# Patient Record
Sex: Female | Born: 1935 | Hispanic: No | Marital: Single
Health system: Midwestern US, Academic
[De-identification: ages and names within clinical notes are randomized; demographics above are authoritative.]

---

## 2016-08-27 MED ORDER — RANEXA 1,000 MG PO TB12
ORAL_TABLET | Freq: Two times a day (BID) | 6 refills | Status: DC
Start: 2016-08-27 — End: 2017-01-20

## 2016-10-20 ENCOUNTER — Encounter: Admit: 2016-10-20 | Discharge: 2016-10-20 | Payer: MEDICARE

## 2016-10-21 LAB — COMPREHENSIVE METABOLIC PANEL
Lab: 18 — ABNORMAL HIGH (ref 0–14)
Lab: 20
Lab: 23

## 2016-11-04 ENCOUNTER — Encounter: Admit: 2016-11-04 | Discharge: 2016-11-04 | Payer: MEDICARE

## 2016-11-04 ENCOUNTER — Ambulatory Visit: Admit: 2016-11-04 | Discharge: 2016-11-05 | Payer: MEDICARE

## 2016-11-04 DIAGNOSIS — I25118 Atherosclerotic heart disease of native coronary artery with other forms of angina pectoris: Principal | ICD-10-CM

## 2016-11-04 DIAGNOSIS — I10 Essential (primary) hypertension: ICD-10-CM

## 2016-11-04 DIAGNOSIS — E119 Type 2 diabetes mellitus without complications: ICD-10-CM

## 2016-11-04 DIAGNOSIS — Z9049 Acquired absence of other specified parts of digestive tract: Principal | ICD-10-CM

## 2016-11-04 DIAGNOSIS — H269 Unspecified cataract: ICD-10-CM

## 2016-11-04 DIAGNOSIS — E785 Hyperlipidemia, unspecified: ICD-10-CM

## 2016-11-04 MED ORDER — FUROSEMIDE 20 MG PO TAB
20 mg | ORAL_TABLET | Freq: Every morning | ORAL | 0 refills | 90.00000 days | Status: AC
Start: 2016-11-04 — End: 2017-03-07

## 2016-11-04 MED ORDER — ASPIRIN 325 MG PO TAB
325 mg | ORAL_TABLET | Freq: Every day | ORAL | 3 refills | 30.00000 days | Status: AC
Start: 2016-11-04 — End: 2017-05-26

## 2016-11-04 NOTE — Assessment & Plan Note
Lab Results   Component Value Date    CHOL 141 (L) 11/04/2015    TRIG 129 11/04/2015    HDL 39 11/04/2015    LDL 82 11/04/2015    VLDL 26 11/04/2015    CHOLHDLC 4 11/04/2015      LDL good on current medical program.

## 2016-11-04 NOTE — Progress Notes
Date of Service: 11/04/2016    Diamond Weber is a 83 y.o. female.       HPI     Sister Shajuana was in the [place] office today for follow-up regarding coronary disease and aortic stenosis.  When I last saw her in December she was actually doing relatively well but lately she has been using more sublingual nitroglycerin and having more trouble with breathlessness.  She tells me that she used 5 sublingual nitroglycerin tablets yesterday.  She says that some days she does not use any nitroglycerin at all.    Last week she took a 20 mg furosemide tablet that she had and did notice that she urinated more and felt better that day.    She did have lab work done last week and her serum creatinine is stable at 2.14.  The hemoglobin was also stable at 10.7.  I am not sure why but her erythrocyte sedimentation rate was quite elevated at 75.    She has not gained weight and has not had problems with peripheral edema.  She denies palpitations, syncope, or near syncope.  She has had no TIA or stroke symptoms.         Vitals:    11/04/16 0841 11/04/16 0849   BP: 152/74 154/68   Pulse: 66    Weight: 66 kg (145 lb 9.6 oz)    Height: 1.626 m (5' 4)      Body mass index is 24.99 kg/m???.     Past Medical History  Patient Active Problem List    Diagnosis Date Noted   ??? Hematochezia 07/24/2015   ??? Centrilobular emphysema (HCC) 06/05/2015     05/2015 - PFT's @ [facility]:  FEV1 1.13 L, 70%.  Diffusion 54% of predicted.  05/29/15 CXR [facility]):  moderate emphysematous changes bilaterally with basilar atelectasis versus scar.  10/2015 - PFT's (Dr. Christena Deem office):  FEV1 53% predicted, TLC 71%.  Unable to cooperate with study, diffusion couldn't be done.  10/2015 - Non-contrast CT negative for interstitial lung disease.     ??? Posterior capsular opacification, left 01/13/2015   ??? Nonrheumatic aortic valve stenosis 12/03/2014     2012 - Echo showed mean gradient 14 mmHg     ??? Pseudophakia, both eyes 11/04/2014 YAG OD 10/16/14 --ant and posterior     ??? 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 Renal insufficiency 12/04/2013     10/2013 - ACE inhibitor discontinued with modest improvement in serum creatinine level.     ??? 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 Type 2 diabetes mellitus without retinopathy (HCC) 11/06/2013   ??? Epiretinal membrane, right eye 11/06/2013     Noted on OCT 11/06/13     ??? CAD (coronary artery disease) 11/11/2008     5/04 - Exercise echo showed anterior and lateral hypokinesis that worsened with stress       6/04 - Cath at [facility]: 3-vessel disease involving LAD, 3rd OM, and ostium and mid RCA       6/04 - CABG x 4 ([facility]): LIMA to LAD, SVG in sequence to 1st DX and 3rd OM,  SVG to distal RCA        7/05 - Stress thallium: EF 76%, non-ischemic.  02/02/05: Exercise Thallium. 6:17 min- 149 bpm (101 MPHR). low risk for significant jeopardized ischemic myocardium. Equivocal area of slight        reversibility in the lateral wall which may be artifactual. The defect may be in the intermedius-type territorial distribution but it is mild. EF 71%  12/03/08: Lexicon Sestamibi: low likelihood for ischemic/jeopardized myocardium. Some subtle fixed reduction in apical tracer activity is noted, but  in light of reasonably preserved segmental wall motion and thickening, this probably represents soft tissue attenuation or apical thinning.  EF 67%. No change since 01/2005  ??? November 05, 2011 Carotid US [facility]):  Potential for stenosis of <40% within bilateral prox internal carotid arteries.  Antegrade flow in the vertebral arteries.  Atherosclerotic plaquing present.     ??? HTN (hypertension) 11/11/2008     11/15/08: Echo: EF 55%. No signficant valvular stenosis or regurgitation. Mild LVH  11/15/08: Renal Duplex at [facility]. No renal artery stenosis.      ??? Diabetes mellitus (HCC) 11/11/2008      2002 - Diagnosis established. Type 2     ??? Dyslipidemia 11/11/2008      Intolerance to Lescol, Pravachol, Zocor due to myalgia (leg pain).     ??? Hypothyroidism 11/11/2008         Review of Systems   Constitution: Positive for malaise/fatigue.   HENT: Positive for hearing loss and hoarse voice.    Eyes: Negative.    Cardiovascular: Positive for chest pain and dyspnea on exertion.   Respiratory: Positive for shortness of breath and wheezing.    Endocrine: Negative.    Hematologic/Lymphatic: Bruises/bleeds easily.   Skin: Positive for dry skin.   Musculoskeletal: Negative.    Gastrointestinal: Negative.    Genitourinary: Negative.    Neurological: Negative.    Psychiatric/Behavioral: Negative.    Allergic/Immunologic: Negative.        Physical Exam    Physical Exam   General Appearance: no distress   Skin: warm, no ulcers or xanthomas   Digits and Nails: no cyanosis or clubbing   Eyes: conjunctivae and lids normal, pupils are equal and round   Teeth/Gums/Palate: dentition unremarkable, no lesions   Lips & Oral Mucosa: no pallor or cyanosis   Neck Veins: normal JVP , neck veins are not distended   Thyroid: no nodules, masses, tenderness or enlargement   Chest Inspection: chest is normal in appearance   Respiratory Effort: breathing comfortably, no respiratory distress   Auscultation/Percussion: lungs clear to auscultation, no rales or rhonchi, no wheezing   PMI: PMI not enlarged or displaced   Cardiac Rhythm: regular rhythm and normal rate   Cardiac Auscultation: S1, S2 normal, no rub, no gallop   Murmurs: grade 3 systolic murmur   Peripheral Circulation: normal peripheral circulation   Carotid Arteries: normal carotid upstroke bilaterally, no bruits   Radial Arteries: normal symmetric radial pulses   Abdominal Aorta: no abdominal aortic bruit   Pedal Pulses: normal symmetric pedal pulses Lower Extremity Edema: no lower extremity edema   Abdominal Exam: soft, non-tender, no masses, bowel sounds normal   Liver & Spleen: no organomegaly   Gait & Station: walks without assistance   Muscle Strength: normal muscle tone   Orientation: oriented to time, place and person   Affect & Mood: appropriate and sustained affect   Language and Memory: patient responsive and seems to comprehend information  Neurologic Exam: neurological assessment grossly intact   Other: moves all extremities        Problems Addressed Today  Encounter Diagnoses   Name Primary?   ??? Coronary artery disease of native heart with stable angina pectoris, unspecified vessel or lesion type (HCC)    ??? Essential hypertension    ??? Dyslipidemia        Assessment and Plan       CAD (coronary artery disease)  Unfortunately she is having more dyspnea and angina since I saw her 6 months ago.  She says, for example, that she used 5 sublingual nitroglycerin tablets yesterday.    I had initially considered the addition of amlodipine to her antianginal medical program but she then told me that she took a 20 mg furosemide one day last week and thought that it did help with her breathlessness and chest discomfort.    She had lab work last week showing stable serum creatinine and I visited with Dr. Suzanna Obey about the possibility of adding furosemide 20 mg per day to see if this helps with her symptoms.  The next step might be adding amlodipine 2.5 mg per day, if necessary.  We'll call her in a couple of weeks to see if the diuretic seems to be helping.    HTN (hypertension)  Her blood pressure definitely is not too low and even if we need to add a little bit of amlodipine I do not think it will hurt.  I see that amlodipine is listed as an allergy but the reaction was some sort of facial flushing and I think we might be able to try to get away with a very low dose if necessary.    Dyslipidemia  Lab Results   Component Value Date    CHOL 141 (L) 11/04/2015 TRIG 129 11/04/2015    HDL 39 11/04/2015    LDL 82 11/04/2015    VLDL 26 11/04/2015    CHOLHDLC 4 11/04/2015      LDL good on current medical program.      Current Medications (including today's revisions)  ??? aspirin 325 mg tablet Take 1 tablet by mouth daily. Take with food.   ??? ferrous sulfate 325 mg (65 mg iron) tablet Take 325 mg by mouth twice daily.   ??? furosemide (LASIX) 20 mg tablet Take 1 tablet by mouth every morning.   ??? hydrALAZINE (APRESOLINE) 100 mg tablet Take 100 mg by mouth twice daily.   ??? insulin detemir(+) (LEVEMIR) 100 unit/mL soln Inject 10 Units under the skin daily.   ??? isosorbide mononitrate CR (IMDUR) 120 mg tablet TAKE 1 TABLET BY MOUTH IN THE MORNING   ??? levothyroxine (SYNTHROID) 50 mcg tablet Take 50 mcg by mouth Daily.   ??? metoprolol XL (TOPROL XL) 100 mg extended release tablet Take 100 mg by mouth daily.   ??? nitroglycerin (NITROSTAT) 0.4 mg tablet Place 1 Tab under tongue every 5 minutes as needed for Chest Pain.   ??? pramipexole (MIRAPEX) 0.5 mg tablet Take 0.5 mg by mouth Daily.   ??? prednisolone acetate (PRED FORTE) 1 % ophthalmic suspension Apply 1 drop to both eyes every 48 hours.   ??? RANEXA 1,000 mg tablet TAKE 1 TABLET BY MOUTH TWICE A DAY   ??? simvastatin (ZOCOR) 20 mg tablet Take 20 mg by mouth At Bedtime Daily.

## 2016-11-04 NOTE — Assessment & Plan Note
Her blood pressure definitely is not too low and even if we need to add a little bit of amlodipine I do not think it will hurt.  I see that amlodipine is listed as an allergy but the reaction was some sort of facial flushing and I think we might be able to try to get away with a very low dose if necessary.

## 2016-11-18 ENCOUNTER — Encounter: Admit: 2016-11-18 | Discharge: 2016-11-18 | Payer: MEDICARE

## 2016-11-23 ENCOUNTER — Encounter: Admit: 2016-11-23 | Discharge: 2016-11-23 | Payer: MEDICARE

## 2016-11-23 MED ORDER — AMLODIPINE 5 MG PO TAB
2.5 mg | ORAL_TABLET | Freq: Every day | ORAL | 3 refills | Status: AC
Start: 2016-11-23 — End: 2017-05-26

## 2016-11-23 NOTE — Progress Notes
Patient reports issues with taking lasix daily incontinence of urine.  She does take it prn for relief of shortness of breath.  Today she reports fullness and some doe.  She did take lasix today for symptoms.  Initial Bp was 180/62  P68.  After sitting for 65min down to 158/68.  Review last office note SDO proposed starting Norvasc 2.5 mg daily.  Discussed with him in clinic.  Will attempt norvasc scheduled bp check in 2 wks patient is to call if issues.

## 2016-12-06 ENCOUNTER — Encounter: Admit: 2016-12-06 | Discharge: 2016-12-06 | Payer: MEDICARE

## 2016-12-06 MED ORDER — ISOSORBIDE MONONITRATE 120 MG PO TB24
ORAL_TABLET | ORAL | 3 refills | 90.00000 days | Status: AC
Start: 2016-12-06 — End: ?

## 2016-12-07 ENCOUNTER — Encounter: Admit: 2016-12-07 | Discharge: 2016-12-07 | Payer: MEDICARE

## 2016-12-07 NOTE — Progress Notes
Patient states he is doing better as far as chest pressure/pain is concerned.  She states she has not required any nitro at all pressures still 140's/60-70's.  She has noticed some congestion and flushing since starting her amlodipine.  She still takes lasix only occasionally because of incontinence.

## 2016-12-08 ENCOUNTER — Encounter: Admit: 2016-12-08 | Discharge: 2016-12-08 | Payer: MEDICARE

## 2016-12-08 NOTE — Telephone Encounter
-----   Message from Michiel Cowboy, MD sent at 12/08/2016  8:44 AM CDT -----  Regarding: RE: Sr Jeanne  Wouldn't hurt to try low-dose furosemide if she's willing.  ----- Message -----  From: Asencion Noble  Sent: 12/07/2016   4:28 PM  To: Michiel Cowboy, MD, Asencion Noble  Subject: Sr Lanae Crumbly was in for bp check since starting norvasc.  She had intolerance of this before and therefore it was started at 2.5mg .  Today she states chest pressure/pain is gone.  She has not had to take nitro since starting.  She does c/o congestion since starting and her face appeared flushed.  Recommendations?  She is still not taking lasix on a regular basis because of incontinence.  Do you think splitting dose 10 mg in am and 10mg  at noon might help?    Thanks  Richardson Landry

## 2016-12-08 NOTE — Telephone Encounter
Wouldn't hurt to try low-dose furosemide if she's willing.

## 2016-12-09 NOTE — Telephone Encounter
Discussed with SDO.  The sx she is experiencing are usually not side effects of the medication.  Advised patient to try taking amlodipine at night to see if sx improve, continue to monitor blood pressure and call with any questions or concerns.  Flag forwarded to f/u with patient in 10-14 days to see if sx improve.

## 2016-12-17 ENCOUNTER — Encounter: Admit: 2016-12-17 | Discharge: 2016-12-17 | Payer: MEDICARE

## 2016-12-17 NOTE — Telephone Encounter
Spoke with pt and pt thinks that her flushing is less but still has the congestion. States that the congestion is tolerable.

## 2016-12-17 NOTE — Telephone Encounter
-----   Message from Betsy Pries, RN sent at 12/09/2016  1:08 PM CDT -----  Regarding: call pt   See note.  Call pt to see if flushing and congestion have improved after switching amlodipine to the evening.

## 2017-01-20 ENCOUNTER — Encounter: Admit: 2017-01-20 | Discharge: 2017-01-20 | Payer: MEDICARE

## 2017-01-20 MED ORDER — RANOLAZINE 1,000 MG PO TB12
1000 mg | ORAL_TABLET | Freq: Two times a day (BID) | ORAL | 6 refills | Status: AC
Start: 2017-01-20 — End: 2017-01-20

## 2017-01-20 MED ORDER — RANOLAZINE 1,000 MG PO TB12
1000 mg | ORAL_TABLET | Freq: Two times a day (BID) | ORAL | 3 refills | Status: AC
Start: 2017-01-20 — End: ?

## 2017-02-04 ENCOUNTER — Encounter: Admit: 2017-02-04 | Discharge: 2017-02-04 | Payer: MEDICARE

## 2017-02-04 ENCOUNTER — Ambulatory Visit: Admit: 2017-02-04 | Discharge: 2017-02-05 | Payer: MEDICARE

## 2017-02-04 DIAGNOSIS — H269 Unspecified cataract: ICD-10-CM

## 2017-02-04 DIAGNOSIS — E119 Type 2 diabetes mellitus without complications: ICD-10-CM

## 2017-02-04 DIAGNOSIS — Z9049 Acquired absence of other specified parts of digestive tract: Principal | ICD-10-CM

## 2017-02-04 DIAGNOSIS — Z947 Corneal transplant status: Principal | ICD-10-CM

## 2017-02-04 DIAGNOSIS — I10 Essential (primary) hypertension: ICD-10-CM

## 2017-02-04 DIAGNOSIS — H26492 Other secondary cataract, left eye: ICD-10-CM

## 2017-02-04 DIAGNOSIS — Z961 Presence of intraocular lens: ICD-10-CM

## 2017-02-04 MED ORDER — PREDNISOLONE ACETATE 1 % OP DRPS
1 [drp] | OPHTHALMIC | 6 refills | 14.00000 days | Status: AC
Start: 2017-02-04 — End: 2017-10-14

## 2017-02-04 NOTE — Assessment & Plan Note
Does the patient have Diabetes? Yes,  and the patient has NO evidence of retinopathy and/or macular edema today.

## 2017-02-04 NOTE — Assessment & Plan Note
DSAEK clear OU without KP  P_PF TIW OU

## 2017-02-04 NOTE — Assessment & Plan Note
Bread crumb changes temp outside vis axis  P--observ

## 2017-02-04 NOTE — Progress Notes
There is no height or weight on file to calculate BMI.              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 Type 2     Bullous Keratopathy of Both Eyes (Resolved)    Pseudophakic Bullous Keratopathy OD>OS  S/p CE OU approx 2011   Pachy: 693 OD, 712 OS 11/06/13         Status post corneal transplant  DSAEK clear OU without KP  P_PF TIW OU    Pseudophakia, both eyes  Capsule Open OD--phimosis stable  Doing well  No new MRx    Posterior capsular opacification, left  Bread crumb changes temp outside vis axis  P--observ    Type 2 diabetes mellitus without complication, without long-term current use of insulin ([place])  Does the patient have Diabetes? Yes,  and the patient has NO evidence of retinopathy and/or macular edema today.    Next Visit:         MRx x   ECC    Gallelei    Atlas    OCT (M)(N)    Orbscan    LG    TF BUT    Schirmer    IOP x   Pach x   Dilate x   BAM x   B-Scan    HVF    TOSM          Kelton Pillar, MD

## 2017-02-04 NOTE — Assessment & Plan Note
Capsule Open OD--phimosis stable  Doing well  No new MRx

## 2017-03-07 ENCOUNTER — Encounter: Admit: 2017-03-07 | Discharge: 2017-03-07 | Payer: MEDICARE

## 2017-03-07 MED ORDER — FUROSEMIDE 20 MG PO TAB
ORAL_TABLET | Freq: Every day | 6 refills | Status: SS
Start: 2017-03-07 — End: 2017-06-24

## 2017-03-31 LAB — COMPREHENSIVE METABOLIC PANEL
Lab: 0.3
Lab: 10 — ABNORMAL HIGH (ref 8.4–10.2)
Lab: 111 — ABNORMAL HIGH (ref 83–110)
Lab: 137 — ABNORMAL LOW (ref 4.20–5.40)
Lab: 16
Lab: 16 — ABNORMAL HIGH (ref 0–14)
Lab: 18
Lab: 2 — ABNORMAL HIGH (ref 0.57–1.11)
Lab: 46 — ABNORMAL HIGH (ref 9.8–20.1)
Lab: 7.5
Lab: 76

## 2017-03-31 LAB — BNP (B-TYPE NATRIURETIC PEPTI)

## 2017-03-31 LAB — CBC
Lab: 10 — ABNORMAL LOW (ref 12.0–16.0)
Lab: 93

## 2017-05-16 ENCOUNTER — Encounter: Admit: 2017-05-16 | Discharge: 2017-05-16 | Payer: MEDICARE

## 2017-05-26 ENCOUNTER — Encounter: Admit: 2017-05-26 | Discharge: 2017-05-26 | Payer: MEDICARE

## 2017-05-26 ENCOUNTER — Ambulatory Visit: Admit: 2017-05-26 | Discharge: 2017-05-27 | Payer: MEDICARE

## 2017-05-26 DIAGNOSIS — I10 Essential (primary) hypertension: Principal | ICD-10-CM

## 2017-05-26 DIAGNOSIS — I350 Nonrheumatic aortic (valve) stenosis: ICD-10-CM

## 2017-05-26 DIAGNOSIS — H269 Unspecified cataract: ICD-10-CM

## 2017-05-26 DIAGNOSIS — I25118 Atherosclerotic heart disease of native coronary artery with other forms of angina pectoris: ICD-10-CM

## 2017-05-26 DIAGNOSIS — E119 Type 2 diabetes mellitus without complications: ICD-10-CM

## 2017-05-26 DIAGNOSIS — Z9049 Acquired absence of other specified parts of digestive tract: Secondary | ICD-10-CM

## 2017-05-26 MED ORDER — NICARDIPINE 20 MG PO CAP
20 mg | ORAL_CAPSULE | Freq: Three times a day (TID) | ORAL | 3 refills | Status: SS
Start: 2017-05-26 — End: 2017-07-07

## 2017-06-10 ENCOUNTER — Encounter: Admit: 2017-06-10 | Discharge: 2017-06-10 | Payer: MEDICARE

## 2017-06-23 ENCOUNTER — Ambulatory Visit: Admit: 2017-06-23 | Discharge: 2017-06-24 | Payer: MEDICARE

## 2017-06-23 ENCOUNTER — Encounter: Admit: 2017-06-23 | Discharge: 2017-06-23 | Payer: MEDICARE

## 2017-06-23 DIAGNOSIS — E119 Type 2 diabetes mellitus without complications: ICD-10-CM

## 2017-06-23 DIAGNOSIS — E785 Hyperlipidemia, unspecified: ICD-10-CM

## 2017-06-23 DIAGNOSIS — Z9049 Acquired absence of other specified parts of digestive tract: Principal | ICD-10-CM

## 2017-06-23 DIAGNOSIS — H269 Unspecified cataract: ICD-10-CM

## 2017-06-23 DIAGNOSIS — I25118 Atherosclerotic heart disease of native coronary artery with other forms of angina pectoris: ICD-10-CM

## 2017-06-23 DIAGNOSIS — I350 Nonrheumatic aortic (valve) stenosis: ICD-10-CM

## 2017-06-23 DIAGNOSIS — N289 Disorder of kidney and ureter, unspecified: ICD-10-CM

## 2017-06-23 DIAGNOSIS — R079 Chest pain, unspecified: Principal | ICD-10-CM

## 2017-06-23 DIAGNOSIS — I10 Essential (primary) hypertension: ICD-10-CM

## 2017-06-23 DIAGNOSIS — I209 Angina pectoris, unspecified: Principal | ICD-10-CM

## 2017-06-23 MED ORDER — SODIUM CHLORIDE 0.9 % IV SOLP
250 mL | INTRAVENOUS | 0 refills | Status: CN | PRN
Start: 2017-06-23 — End: ?

## 2017-06-23 MED ORDER — LIDOCAINE (PF) 10 MG/ML (1 %) IJ SOLN
.1-2 mL | INTRAMUSCULAR | 0 refills | Status: CN | PRN
Start: 2017-06-23 — End: ?

## 2017-06-24 ENCOUNTER — Ambulatory Visit: Admit: 2017-06-24 | Discharge: 2017-06-24 | Payer: MEDICARE

## 2017-06-24 ENCOUNTER — Encounter: Admit: 2017-06-24 | Discharge: 2017-06-24 | Payer: MEDICARE

## 2017-06-24 DIAGNOSIS — Z951 Presence of aortocoronary bypass graft: ICD-10-CM

## 2017-06-24 DIAGNOSIS — I10 Essential (primary) hypertension: ICD-10-CM

## 2017-06-24 DIAGNOSIS — R079 Chest pain, unspecified: Principal | ICD-10-CM

## 2017-06-24 DIAGNOSIS — E119 Type 2 diabetes mellitus without complications: ICD-10-CM

## 2017-06-24 DIAGNOSIS — E785 Hyperlipidemia, unspecified: ICD-10-CM

## 2017-06-24 DIAGNOSIS — I25118 Atherosclerotic heart disease of native coronary artery with other forms of angina pectoris: Secondary | ICD-10-CM

## 2017-06-24 DIAGNOSIS — N289 Disorder of kidney and ureter, unspecified: ICD-10-CM

## 2017-06-24 LAB — CBC
Lab: 10 g/dL — ABNORMAL LOW (ref 12.0–15.0)
Lab: 14 % (ref 11–15)
Lab: 281 K/UL — ABNORMAL LOW (ref >60)
Lab: 29 % — ABNORMAL LOW (ref 36–45)
Lab: 30 pg — ABNORMAL HIGH (ref 26–34)
Lab: 34 g/dL — ABNORMAL HIGH (ref 32.0–36.0)
Lab: 7.2 K/UL (ref 4.5–11.0)
Lab: 8.2 FL — ABNORMAL LOW (ref >60)
Lab: 89 FL — ABNORMAL HIGH (ref 80–100)

## 2017-06-24 LAB — BASIC METABOLIC PANEL
Lab: 101 MMOL/L — ABNORMAL LOW (ref 98–110)
Lab: 135 MMOL/L — ABNORMAL LOW (ref 137–147)

## 2017-06-24 LAB — LIPID PROFILE
Lab: 150 mg/dL — ABNORMAL LOW (ref <200)
Lab: 40 mg/dL — ABNORMAL LOW (ref >40)
Lab: 85 mg/dL — ABNORMAL HIGH (ref <100)

## 2017-06-24 LAB — POC GLUCOSE: Lab: 97 mg/dL (ref 70–100)

## 2017-06-24 MED ORDER — TEMAZEPAM 15 MG PO CAP
15 mg | Freq: Every evening | ORAL | 0 refills | Status: DC | PRN
Start: 2017-06-24 — End: 2017-06-25

## 2017-06-24 MED ORDER — ONDANSETRON HCL (PF) 4 MG/2 ML IJ SOLN
4 mg | INTRAVENOUS | 0 refills | Status: DC | PRN
Start: 2017-06-24 — End: 2017-06-25

## 2017-06-24 MED ORDER — ACETAMINOPHEN 325 MG PO TAB
650 mg | ORAL | 0 refills | Status: DC | PRN
Start: 2017-06-24 — End: 2017-06-25

## 2017-06-24 MED ORDER — SODIUM CHLORIDE 0.9 % IV SOLP
250 mL | INTRAVENOUS | 0 refills | Status: DC | PRN
Start: 2017-06-24 — End: 2017-06-25
  Administered 2017-06-24: 15:00:00 250 mL via INTRAVENOUS

## 2017-06-24 MED ORDER — LIDOCAINE (PF) 10 MG/ML (1 %) IJ SOLN
.1-2 mL | INTRAMUSCULAR | 0 refills | Status: DC | PRN
Start: 2017-06-24 — End: 2017-06-25

## 2017-06-24 MED ORDER — NITROGLYCERIN 0.4 MG SL SUBL
.4 mg | SUBLINGUAL | 0 refills | Status: DC | PRN
Start: 2017-06-24 — End: 2017-06-25

## 2017-06-24 MED ORDER — SODIUM CHLORIDE 0.9 % IV SOLP
1000 mL | INTRAVENOUS | 0 refills | Status: DC
Start: 2017-06-24 — End: 2017-06-25
  Administered 2017-06-24: 15:00:00 1000 mL via INTRAVENOUS

## 2017-06-24 MED ORDER — FUROSEMIDE 40 MG PO TAB
40 mg | ORAL_TABLET | Freq: Every morning | ORAL | 3 refills | Status: SS
Start: 2017-06-24 — End: 2017-07-07

## 2017-06-24 MED ORDER — DIPHENHYDRAMINE HCL 50 MG/ML IJ SOLN
25 mg | INTRAVENOUS | 0 refills | Status: DC | PRN
Start: 2017-06-24 — End: 2017-06-25

## 2017-06-24 MED ORDER — ALUMINUM-MAGNESIUM HYDROXIDE 200-200 MG/5 ML PO SUSP
30 mL | ORAL | 0 refills | Status: DC | PRN
Start: 2017-06-24 — End: 2017-06-25

## 2017-06-24 MED ORDER — ASPIRIN 325 MG PO TAB
325 mg | Freq: Once | ORAL | 0 refills | Status: DC
Start: 2017-06-24 — End: 2017-06-25

## 2017-06-24 MED ORDER — POTASSIUM CHLORIDE 10 MEQ PO TBER
10 meq | ORAL_CAPSULE | Freq: Every day | ORAL | 3 refills | Status: SS
Start: 2017-06-24 — End: 2017-07-07

## 2017-06-24 MED ORDER — DIPHENHYDRAMINE HCL 25 MG PO CAP
25 mg | ORAL | 0 refills | Status: DC | PRN
Start: 2017-06-24 — End: 2017-06-25

## 2017-07-01 ENCOUNTER — Ambulatory Visit: Admit: 2017-07-01 | Discharge: 2017-07-01 | Payer: MEDICARE

## 2017-07-01 ENCOUNTER — Encounter: Admit: 2017-07-01 | Discharge: 2017-07-01 | Payer: MEDICARE

## 2017-07-01 DIAGNOSIS — K922 Gastrointestinal hemorrhage, unspecified: ICD-10-CM

## 2017-07-01 LAB — PROTIME INR (PT): Lab: 1 M/UL — ABNORMAL LOW (ref 0.8–1.2)

## 2017-07-01 LAB — COMPREHENSIVE METABOLIC PANEL
Lab: 0.4 mg/dL (ref 0.3–1.2)
Lab: 11 U/L (ref 7–40)
Lab: 131 MMOL/L — ABNORMAL LOW (ref 137–147)
Lab: 21 MMOL/L (ref 21–30)
Lab: 23 mL/min — ABNORMAL LOW (ref >60)
Lab: 28 mL/min — ABNORMAL LOW (ref >60)
Lab: 3.3 g/dL — ABNORMAL LOW (ref 3.5–5.0)
Lab: 5.5 g/dL — ABNORMAL LOW (ref 6.0–8.0)
Lab: 51 U/L (ref 25–110)
Lab: 9 U/L (ref 7–56)
Lab: 7 (ref 3–12)

## 2017-07-01 LAB — PHOSPHORUS: Lab: 3.5 mg/dL — ABNORMAL HIGH (ref 2.0–4.5)

## 2017-07-01 LAB — CBC
Lab: 7 10*3/uL (ref 4.5–11.0)
Lab: 15 % — ABNORMAL HIGH (ref 11–15)
Lab: 2.6 M/UL — ABNORMAL LOW (ref 4.0–5.0)
Lab: 225 10*3/uL (ref 150–400)
Lab: 23 % — ABNORMAL LOW (ref 36–45)
Lab: 30 pg (ref 26–34)
Lab: 34 g/dL (ref 32.0–36.0)
Lab: 5.4 10*3/uL (ref 4.5–11.0)
Lab: 8 g/dL — ABNORMAL LOW (ref 12.0–15.0)
Lab: 8.3 FL (ref 7–11)
Lab: 88 FL (ref 80–100)

## 2017-07-01 LAB — BLOOD GASES, ARTERIAL
Lab: 111 mmHg — ABNORMAL HIGH (ref 80–100)
Lab: 23 MMOL/L — ABNORMAL LOW (ref 21–28)
Lab: 7.3 MMOL/L (ref 7.35–7.45)

## 2017-07-01 LAB — THYROID STIMULATING HORMONE-TSH: Lab: 1.9 uU/mL (ref 0.35–5.00)

## 2017-07-01 LAB — TROPONIN-I
Lab: 0 ng/mL (ref 0.0–0.05)
Lab: 0 ng/mL — ABNORMAL LOW (ref 0.0–0.05)

## 2017-07-01 LAB — LACTIC ACID (BG - RAPID LACTATE)
Lab: 1.2 MMOL/L (ref 0.5–2.0)
Lab: 1.3 MMOL/L (ref 0.5–2.0)

## 2017-07-01 LAB — PTT (APTT): Lab: 19 s — ABNORMAL LOW (ref 24.0–36.5)

## 2017-07-01 LAB — BNP (B-TYPE NATRIURETIC PEPTI): Lab: 155 pg/mL — ABNORMAL HIGH (ref 0–100)

## 2017-07-01 LAB — MAGNESIUM: Lab: 1.8 mg/dL — ABNORMAL HIGH (ref 1.6–2.6)

## 2017-07-01 LAB — POC GLUCOSE
Lab: 171 mg/dL — ABNORMAL HIGH (ref 70–100)
Lab: 140 mg/dL — ABNORMAL HIGH (ref 70–100)
Lab: 132 mg/dL — ABNORMAL HIGH (ref 70–100)

## 2017-07-01 MED ORDER — LEVOTHYROXINE 50 MCG PO TAB
50 ug | Freq: Every day | ORAL | 0 refills | Status: DC
Start: 2017-07-01 — End: 2017-07-07
  Administered 2017-07-01 – 2017-07-07 (×7): 50 ug via ORAL

## 2017-07-01 MED ORDER — INSULIN ASPART 100 UNIT/ML SC FLEXPEN
0-6 [IU] | Freq: Before meals | SUBCUTANEOUS | 0 refills | Status: DC
Start: 2017-07-01 — End: 2017-07-07
  Administered 2017-07-02 – 2017-07-05 (×2): 2 [IU] via SUBCUTANEOUS

## 2017-07-01 MED ORDER — PANTOPRAZOLE 40 MG IV SOLR
40 mg | Freq: Two times a day (BID) | INTRAVENOUS | 0 refills | Status: DC
Start: 2017-07-01 — End: 2017-07-07
  Administered 2017-07-01 – 2017-07-07 (×12): 40 mg via INTRAVENOUS

## 2017-07-01 MED ORDER — LACTATED RINGERS IV SOLP
500 mL | Freq: Once | INTRAVENOUS | 0 refills | Status: CP
Start: 2017-07-01 — End: ?
  Administered 2017-07-01: 17:00:00 500 mL via INTRAVENOUS

## 2017-07-01 MED ORDER — ONDANSETRON HCL (PF) 4 MG/2 ML IJ SOLN
4 mg | INTRAVENOUS | 0 refills | Status: DC | PRN
Start: 2017-07-01 — End: 2017-07-07
  Administered 2017-07-01 – 2017-07-02 (×2): 4 mg via INTRAVENOUS

## 2017-07-01 MED ORDER — PRAMIPEXOLE 1 MG PO TAB
.5 mg | Freq: Every day | ORAL | 0 refills | Status: DC
Start: 2017-07-01 — End: 2017-07-07
  Administered 2017-07-01 – 2017-07-07 (×5): 0.5 mg via ORAL

## 2017-07-02 LAB — POC GLUCOSE
Lab: 152 mg/dL — ABNORMAL HIGH (ref 70–100)
Lab: 237 mg/dL — ABNORMAL HIGH (ref 70–100)
Lab: 253 mg/dL — ABNORMAL HIGH (ref 70–100)
Lab: 175 mg/dL — ABNORMAL HIGH (ref 70–100)
Lab: 111 mg/dL — ABNORMAL HIGH (ref 70–100)

## 2017-07-02 LAB — CBC
Lab: 15 % — ABNORMAL HIGH (ref 11–15)
Lab: 2.7 M/UL — ABNORMAL LOW (ref 4.0–5.0)
Lab: 24 % — ABNORMAL LOW (ref 36–45)
Lab: 267 10*3/uL — ABNORMAL LOW (ref >60)
Lab: 29 pg — ABNORMAL HIGH (ref 26–34)
Lab: 33 g/dL (ref 32.0–36.0)
Lab: 8.1 g/dL — ABNORMAL LOW (ref 12.0–15.0)
Lab: 8.3 FL (ref >60)
Lab: 8.8 10*3/uL (ref 4.5–11.0)
Lab: 88 FL — ABNORMAL HIGH (ref 80–100)
Lab: 148 10*3/uL — ABNORMAL LOW (ref 150–400)
Lab: 16 % — ABNORMAL HIGH (ref 11–15)
Lab: 2.3 M/UL — ABNORMAL LOW (ref 4.0–5.0)
Lab: 20 % — ABNORMAL LOW (ref 36–45)
Lab: 30 pg (ref 26–34)
Lab: 34 g/dL (ref 32.0–36.0)
Lab: 6.4 10*3/uL (ref 4.5–11.0)
Lab: 7 g/dL — ABNORMAL LOW (ref 12.0–15.0)
Lab: 8.5 FL (ref 7–11)
Lab: 88 FL (ref 80–100)
Lab: 14 % (ref 11–15)
Lab: 223 10*3/uL (ref 150–400)
Lab: 3 M/UL — ABNORMAL LOW (ref 4.0–5.0)
Lab: 30 pg (ref 26–34)
Lab: 33 g/dL (ref 32.0–36.0)
Lab: 6.9 K/UL (ref 4.5–11.0)
Lab: 89 FL (ref 80–100)
Lab: 9.2 g/dL — ABNORMAL LOW (ref 12.0–15.0)

## 2017-07-02 LAB — MAGNESIUM: Lab: 1.9 mg/dL — ABNORMAL LOW (ref 1.6–2.6)

## 2017-07-02 LAB — COMPREHENSIVE METABOLIC PANEL: Lab: 131 MMOL/L — ABNORMAL LOW (ref >60)

## 2017-07-02 LAB — CREATININE-URINE RANDOM: Lab: 46 mg/dL (ref 8.5–10.6)

## 2017-07-02 LAB — PROTIME INR (PT): Lab: 1 (ref 0.8–1.2)

## 2017-07-02 LAB — PHOSPHORUS: Lab: 4.3 mg/dL — ABNORMAL LOW (ref >60)

## 2017-07-02 LAB — PTT (APTT): Lab: 22 s — ABNORMAL LOW (ref 24.0–36.5)

## 2017-07-02 LAB — SODIUM-URINE RANDOM: Lab: 75 MMOL/L — ABNORMAL HIGH (ref 0.4–1.24)

## 2017-07-02 MED ORDER — BISACODYL 5 MG PO TBEC
10 mg | Freq: Once | ORAL | 0 refills | Status: AC | PRN
Start: 2017-07-02 — End: ?

## 2017-07-02 MED ORDER — PEG-ELECTROLYTE SOLN 420 GRAM PO SOLR
4 L | Freq: Once | ORAL | 0 refills | Status: CP
Start: 2017-07-02 — End: ?
  Administered 2017-07-02: 19:00:00 4 L via ORAL

## 2017-07-02 MED ORDER — PEG-ELECTROLYTE SOLN 420 GRAM PO SOLR
4 L | ORAL | 0 refills | Status: DC
Start: 2017-07-02 — End: 2017-07-04

## 2017-07-02 MED ORDER — HYDRALAZINE 100 MG PO TAB
100 mg | Freq: Two times a day (BID) | ORAL | 0 refills | Status: DC
Start: 2017-07-02 — End: 2017-07-03
  Administered 2017-07-03: 02:00:00 100 mg via ORAL

## 2017-07-02 MED ORDER — PEG-ELECTROLYTE SOLN 420 GRAM PO SOLR
2 L | ORAL | 0 refills | Status: DC | PRN
Start: 2017-07-02 — End: 2017-07-04

## 2017-07-03 ENCOUNTER — Encounter: Admit: 2017-07-03 | Discharge: 2017-07-03 | Payer: MEDICARE

## 2017-07-03 LAB — CBC
Lab: 10 g/dL — ABNORMAL LOW (ref 12.0–15.0)
Lab: 3.4 M/UL — ABNORMAL LOW (ref 4.0–5.0)
Lab: 30 % — ABNORMAL LOW (ref 36–45)
Lab: 9.3 10*3/uL (ref 4.5–11.0)
Lab: 90 FL (ref 80–100)
Lab: 15 % — ABNORMAL HIGH (ref 11–15)
Lab: 2.7 M/UL — ABNORMAL LOW (ref 4.0–5.0)
Lab: 230 10*3/uL (ref 150–400)
Lab: 24 % — ABNORMAL LOW (ref 36–45)
Lab: 31 pg (ref 26–34)
Lab: 34 g/dL (ref 32.0–36.0)
Lab: 6.7 10*3/uL (ref 4.5–11.0)
Lab: 8.6 g/dL — ABNORMAL LOW (ref 12.0–15.0)
Lab: 89 FL (ref 80–100)
Lab: 6 K/UL — ABNORMAL LOW (ref 4.5–11.0)

## 2017-07-03 LAB — MAGNESIUM: Lab: 1.8 mg/dL — ABNORMAL LOW (ref >60)

## 2017-07-03 LAB — POC GLUCOSE
Lab: 158 mg/dL — ABNORMAL HIGH (ref 70–100)
Lab: 174 mg/dL — ABNORMAL HIGH (ref 70–100)
Lab: 166 mg/dL — ABNORMAL HIGH (ref 70–100)
Lab: 142 mg/dL — ABNORMAL HIGH (ref 70–100)
Lab: 149 mg/dL — ABNORMAL HIGH (ref 70–100)

## 2017-07-03 LAB — COMPREHENSIVE METABOLIC PANEL: Lab: 133 MMOL/L — ABNORMAL LOW (ref 137–147)

## 2017-07-03 LAB — PTT (APTT): Lab: 27 s — ABNORMAL LOW (ref 24.0–36.5)

## 2017-07-03 LAB — PHOSPHORUS: Lab: 3.5 mg/dL — ABNORMAL LOW (ref >60)

## 2017-07-03 LAB — PROTIME INR (PT): Lab: 1 pg (ref >60)

## 2017-07-03 MED ORDER — MIDAZOLAM 1 MG/ML IJ SOLN
5 mg | Freq: Once | INTRAVENOUS | 0 refills | Status: CP
Start: 2017-07-03 — End: ?

## 2017-07-03 MED ORDER — SODIUM CHLORIDE 0.9 % IV SOLP
INTRAVENOUS | 0 refills | Status: DC
Start: 2017-07-03 — End: 2017-07-04

## 2017-07-03 MED ORDER — FENTANYL CITRATE (PF) 50 MCG/ML IJ SOLN
75 ug | Freq: Once | INTRAVENOUS | 0 refills | Status: CP
Start: 2017-07-03 — End: ?

## 2017-07-03 MED ADMIN — MIDAZOLAM 1 MG/ML IJ SOLN [10607]: 5 mg | INTRAVENOUS | @ 18:00:00 | Stop: 2017-07-03 | NDC 00409230521

## 2017-07-03 MED ADMIN — FENTANYL CITRATE (PF) 50 MCG/ML IJ SOLN [3037]: 75 ug | INTRAVENOUS | @ 18:00:00 | Stop: 2017-07-03 | NDC 00409909409

## 2017-07-04 LAB — POC GLUCOSE
Lab: 212 mg/dL — ABNORMAL HIGH (ref >60)
Lab: 157 mg/dL — ABNORMAL HIGH (ref 70–100)
Lab: 261 mg/dL — ABNORMAL HIGH (ref 70–100)
Lab: 202 mg/dL — ABNORMAL HIGH (ref 70–100)

## 2017-07-04 LAB — MAGNESIUM: Lab: 1.7 mg/dL — ABNORMAL LOW (ref 1.6–2.6)

## 2017-07-04 LAB — PTT (APTT): Lab: 27 s — ABNORMAL LOW (ref 24.0–36.5)

## 2017-07-04 LAB — PROTIME INR (PT): Lab: 1 M/UL — ABNORMAL LOW (ref 0.8–1.2)

## 2017-07-04 LAB — COMPREHENSIVE METABOLIC PANEL: Lab: 136 MMOL/L — ABNORMAL LOW (ref 137–147)

## 2017-07-04 LAB — CBC
Lab: 5.9 K/UL (ref 4.5–11.0)
Lab: 6.2 10*3/uL — ABNORMAL LOW (ref >60)
Lab: 6.3 K/UL — ABNORMAL LOW (ref 4.5–11.0)

## 2017-07-04 LAB — PHOSPHORUS: Lab: 3.4 mg/dL — ABNORMAL LOW (ref >60)

## 2017-07-04 MED ORDER — INSULIN GLARGINE 100 UNIT/ML (3 ML) SC INJ PEN
5 [IU] | Freq: Every evening | SUBCUTANEOUS | 0 refills | Status: DC
Start: 2017-07-04 — End: 2017-07-06
  Administered 2017-07-05: 05:00:00 5 [IU] via SUBCUTANEOUS

## 2017-07-05 ENCOUNTER — Encounter: Admit: 2017-07-05 | Discharge: 2017-07-05 | Payer: MEDICARE

## 2017-07-05 DIAGNOSIS — I10 Essential (primary) hypertension: ICD-10-CM

## 2017-07-05 DIAGNOSIS — H269 Unspecified cataract: ICD-10-CM

## 2017-07-05 DIAGNOSIS — E119 Type 2 diabetes mellitus without complications: ICD-10-CM

## 2017-07-05 DIAGNOSIS — Z9049 Acquired absence of other specified parts of digestive tract: Principal | ICD-10-CM

## 2017-07-05 LAB — PROTIME INR (PT): Lab: 0.9 M/UL — ABNORMAL LOW (ref >60)

## 2017-07-05 LAB — CBC
Lab: 7.3 K/UL — ABNORMAL LOW (ref 4.5–11.0)
Lab: 16 % — ABNORMAL HIGH (ref 11–15)
Lab: 2.6 M/UL — ABNORMAL LOW (ref >60)
Lab: 23 % — ABNORMAL LOW (ref >60)
Lab: 277 K/UL (ref 150–400)
Lab: 30 pg (ref 26–34)
Lab: 33 g/dL (ref 32.0–36.0)
Lab: 6.7 K/UL — ABNORMAL HIGH (ref >60)
Lab: 7.7 FL (ref 7–11)
Lab: 89 FL (ref 80–100)

## 2017-07-05 LAB — PTT (APTT): Lab: 28 s — ABNORMAL LOW (ref 24.0–36.5)

## 2017-07-05 LAB — POC GLUCOSE
Lab: 184 mg/dL — ABNORMAL HIGH (ref 70–100)
Lab: 249 mg/dL — ABNORMAL HIGH (ref 70–100)
Lab: 263 mg/dL — ABNORMAL HIGH (ref 70–100)
Lab: 159 mg/dL — ABNORMAL HIGH (ref 70–100)
Lab: 233 mg/dL — ABNORMAL HIGH (ref 70–100)

## 2017-07-05 LAB — MAGNESIUM: Lab: 1.7 mg/dL — ABNORMAL LOW (ref >60)

## 2017-07-05 LAB — PHOSPHORUS: Lab: 3.2 mg/dL — ABNORMAL LOW (ref >60)

## 2017-07-05 LAB — COMPREHENSIVE METABOLIC PANEL: Lab: 139 MMOL/L — ABNORMAL LOW (ref >60)

## 2017-07-05 MED ORDER — SIMVASTATIN 20 MG PO TAB
20 mg | Freq: Every evening | ORAL | 0 refills | Status: DC
Start: 2017-07-05 — End: 2017-07-07
  Administered 2017-07-06 – 2017-07-07 (×2): 20 mg via ORAL

## 2017-07-05 MED ORDER — PREDNISOLONE ACETATE 1 % OP DRPS
1 [drp] | OPHTHALMIC | 0 refills | Status: DC
Start: 2017-07-05 — End: 2017-07-07
  Administered 2017-07-06: 14:00:00 1 [drp] via OPHTHALMIC

## 2017-07-05 MED ORDER — RANOLAZINE 1,000 MG PO TB12
1000 mg | Freq: Two times a day (BID) | ORAL | 0 refills | Status: DC
Start: 2017-07-05 — End: 2017-07-07
  Administered 2017-07-05 – 2017-07-07 (×5): 1000 mg via ORAL

## 2017-07-05 MED ORDER — METOPROLOL SUCCINATE 50 MG PO TB24
50 mg | Freq: Every day | ORAL | 0 refills | Status: DC
Start: 2017-07-05 — End: 2017-07-06
  Administered 2017-07-05 – 2017-07-06 (×2): 50 mg via ORAL

## 2017-07-05 MED ORDER — MAGNESIUM SULFATE IN D5W 1 GRAM/100 ML IV PGBK
1 g | INTRAVENOUS | 0 refills | Status: CP
Start: 2017-07-05 — End: ?
  Administered 2017-07-05 (×2): 1 g via INTRAVENOUS

## 2017-07-06 LAB — PTT (APTT): Lab: 28 s — ABNORMAL LOW (ref >40)

## 2017-07-06 LAB — POC GLUCOSE
Lab: 175 mg/dL — ABNORMAL HIGH (ref 70–100)
Lab: 177 mg/dL — ABNORMAL HIGH (ref 70–100)
Lab: 189 mg/dL — ABNORMAL HIGH (ref 70–100)
Lab: 257 mg/dL — ABNORMAL HIGH (ref 70–100)

## 2017-07-06 LAB — PROTIME INR (PT): Lab: 1 MMOL/L — ABNORMAL HIGH (ref >60)

## 2017-07-06 LAB — MAGNESIUM: Lab: 2 mg/dL — ABNORMAL LOW (ref 1.6–2.6)

## 2017-07-06 LAB — PHOSPHORUS: Lab: 3.2 mg/dL — ABNORMAL HIGH (ref 2.0–4.5)

## 2017-07-06 LAB — CBC: Lab: 7 K/UL — ABNORMAL HIGH (ref 4.5–11.0)

## 2017-07-06 LAB — COMPREHENSIVE METABOLIC PANEL: Lab: 137 MMOL/L — ABNORMAL LOW (ref >60)

## 2017-07-06 MED ORDER — METOPROLOL SUCCINATE 100 MG PO TB24
100 mg | Freq: Every day | ORAL | 0 refills | Status: DC
Start: 2017-07-06 — End: 2017-07-07
  Administered 2017-07-07: 14:00:00 100 mg via ORAL

## 2017-07-06 MED ORDER — DEXTRAN 70-HYPROMELLOSE (PF) 0.1-0.3 % OP DPET
1 [drp] | OPHTHALMIC | 0 refills | Status: DC | PRN
Start: 2017-07-06 — End: 2017-07-07
  Administered 2017-07-06: 23:00:00 1 [drp] via OPHTHALMIC

## 2017-07-06 MED ORDER — INSULIN GLARGINE 100 UNIT/ML (3 ML) SC INJ PEN
10 [IU] | Freq: Every evening | SUBCUTANEOUS | 0 refills | Status: DC
Start: 2017-07-06 — End: 2017-07-07

## 2017-07-06 MED ORDER — METOPROLOL SUCCINATE 50 MG PO TB24
50 mg | Freq: Once | ORAL | 0 refills | Status: CP
Start: 2017-07-06 — End: ?
  Administered 2017-07-06: 20:00:00 50 mg via ORAL

## 2017-07-06 MED ORDER — ISOSORBIDE MONONITRATE 60 MG PO TB24
120 mg | Freq: Every day | ORAL | 0 refills | Status: DC
Start: 2017-07-06 — End: 2017-07-07
  Administered 2017-07-06 – 2017-07-07 (×2): 120 mg via ORAL

## 2017-07-07 ENCOUNTER — Encounter: Admit: 2017-07-07 | Discharge: 2017-07-07 | Payer: MEDICARE

## 2017-07-07 ENCOUNTER — Inpatient Hospital Stay
Admit: 2017-07-01 | Discharge: 2017-07-07 | Disposition: A | Discharge status: Home / Self Care | Payer: MEDICARE | Source: Other Acute Inpatient Hospital

## 2017-07-07 DIAGNOSIS — I129 Hypertensive chronic kidney disease with stage 1 through stage 4 chronic kidney disease, or unspecified chronic kidney disease: ICD-10-CM

## 2017-07-07 DIAGNOSIS — N179 Acute kidney failure, unspecified: ICD-10-CM

## 2017-07-07 DIAGNOSIS — G2581 Restless legs syndrome: ICD-10-CM

## 2017-07-07 DIAGNOSIS — I2510 Atherosclerotic heart disease of native coronary artery without angina pectoris: ICD-10-CM

## 2017-07-07 DIAGNOSIS — J432 Centrilobular emphysema: ICD-10-CM

## 2017-07-07 DIAGNOSIS — Z9841 Cataract extraction status, right eye: ICD-10-CM

## 2017-07-07 DIAGNOSIS — N189 Chronic kidney disease, unspecified: ICD-10-CM

## 2017-07-07 DIAGNOSIS — E1022 Type 1 diabetes mellitus with diabetic chronic kidney disease: ICD-10-CM

## 2017-07-07 DIAGNOSIS — K635 Polyp of colon: ICD-10-CM

## 2017-07-07 DIAGNOSIS — Z951 Presence of aortocoronary bypass graft: ICD-10-CM

## 2017-07-07 DIAGNOSIS — K921 Melena: Principal | ICD-10-CM

## 2017-07-07 DIAGNOSIS — R079 Chest pain, unspecified: ICD-10-CM

## 2017-07-07 DIAGNOSIS — R5383 Other fatigue: ICD-10-CM

## 2017-07-07 DIAGNOSIS — I4510 Unspecified right bundle-branch block: ICD-10-CM

## 2017-07-07 DIAGNOSIS — N184 Chronic kidney disease, stage 4 (severe): ICD-10-CM

## 2017-07-07 DIAGNOSIS — R197 Diarrhea, unspecified: ICD-10-CM

## 2017-07-07 DIAGNOSIS — R531 Weakness: ICD-10-CM

## 2017-07-07 DIAGNOSIS — R5381 Other malaise: ICD-10-CM

## 2017-07-07 DIAGNOSIS — E039 Hypothyroidism, unspecified: ICD-10-CM

## 2017-07-07 DIAGNOSIS — Z9842 Cataract extraction status, left eye: ICD-10-CM

## 2017-07-07 DIAGNOSIS — Z7982 Long term (current) use of aspirin: ICD-10-CM

## 2017-07-07 DIAGNOSIS — Z794 Long term (current) use of insulin: ICD-10-CM

## 2017-07-07 DIAGNOSIS — K5730 Diverticulosis of large intestine without perforation or abscess without bleeding: ICD-10-CM

## 2017-07-07 DIAGNOSIS — Z947 Corneal transplant status: ICD-10-CM

## 2017-07-07 DIAGNOSIS — E785 Hyperlipidemia, unspecified: ICD-10-CM

## 2017-07-07 LAB — CBC AND DIFF
Lab: 0.1 10*3/uL (ref 0–0.20)
Lab: 0.3 10*3/uL (ref 0–0.45)
Lab: 0.5 10*3/uL (ref 0–0.80)
Lab: 1 % (ref 0–2)
Lab: 1.3 10*3/uL (ref 1.0–4.8)
Lab: 16 % — ABNORMAL HIGH (ref >60)
Lab: 18 % — ABNORMAL LOW (ref 24–44)
Lab: 2.6 M/UL — ABNORMAL LOW (ref 4.0–5.0)
Lab: 23 % — ABNORMAL LOW (ref 36–45)
Lab: 30 pg — ABNORMAL LOW (ref 26–34)
Lab: 33 g/dL — ABNORMAL HIGH (ref 32.0–36.0)
Lab: 330 K/UL (ref >60)
Lab: 4 % (ref 0–5)
Lab: 4.9 10*3/uL (ref 1.8–7.0)
Lab: 7 % (ref 4–12)
Lab: 7 K/UL — ABNORMAL LOW (ref 4.5–11.0)
Lab: 7.4 FL (ref 7–11)
Lab: 70 % (ref 41–77)
Lab: 8 g/dL — ABNORMAL LOW (ref 12.0–15.0)
Lab: 90 FL — ABNORMAL LOW (ref 80–100)

## 2017-07-07 LAB — POC GLUCOSE
Lab: 137 mg/dL — ABNORMAL HIGH (ref 70–100)
Lab: 156 mg/dL — ABNORMAL HIGH (ref 70–100)
Lab: 165 mg/dL — ABNORMAL HIGH (ref 70–100)
Lab: 235 mg/dL — ABNORMAL HIGH (ref 70–100)

## 2017-07-07 MED ORDER — FUROSEMIDE 40 MG PO TAB
40 mg | ORAL_TABLET | Freq: Every morning | ORAL | 3 refills | 90.00000 days | Status: AC
Start: 2017-07-07 — End: ?

## 2017-07-07 MED ORDER — HYDRALAZINE 100 MG PO TAB
50 mg | Freq: Two times a day (BID) | ORAL | 0 refills | 30.00000 days | Status: AC
Start: 2017-07-07 — End: 2017-10-13

## 2017-07-07 MED ORDER — ASPIRIN 81 MG PO TBEC
81 mg | ORAL_TABLET | Freq: Every day | ORAL | 3 refills | Status: SS
Start: 2017-07-07 — End: 2017-07-14

## 2017-07-07 MED ORDER — MAGNESIUM HYDROXIDE 2,400 MG/10 ML PO SUSP
10 mL | Freq: Once | ORAL | 0 refills | Status: CP
Start: 2017-07-07 — End: ?
  Administered 2017-07-07: 14:00:00 10 mL via ORAL

## 2017-07-07 MED ORDER — NICARDIPINE 20 MG PO CAP
20 mg | ORAL_CAPSULE | Freq: Three times a day (TID) | ORAL | 3 refills | Status: AC
Start: 2017-07-07 — End: 2017-07-14

## 2017-07-07 MED ORDER — POTASSIUM CHLORIDE 10 MEQ PO TBER
10 meq | ORAL_CAPSULE | Freq: Every day | ORAL | 3 refills | 30.00000 days | Status: AC
Start: 2017-07-07 — End: ?

## 2017-07-11 ENCOUNTER — Encounter: Admit: 2017-07-11 | Discharge: 2017-07-11 | Payer: MEDICARE

## 2017-07-11 ENCOUNTER — Ambulatory Visit: Admit: 2017-07-11 | Discharge: 2017-07-11 | Payer: MEDICARE

## 2017-07-11 DIAGNOSIS — I10 Essential (primary) hypertension: ICD-10-CM

## 2017-07-11 DIAGNOSIS — Z9049 Acquired absence of other specified parts of digestive tract: Principal | ICD-10-CM

## 2017-07-11 DIAGNOSIS — H269 Unspecified cataract: ICD-10-CM

## 2017-07-11 DIAGNOSIS — E119 Type 2 diabetes mellitus without complications: ICD-10-CM

## 2017-07-11 LAB — URINALYSIS DIPSTICK REFLEX TO CULTURE
Lab: NEGATIVE K/UL (ref 0–0.45)
Lab: NEGATIVE MMOL/L (ref 21–30)
Lab: NEGATIVE U/L (ref 7–40)
Lab: NEGATIVE U/L (ref 7–56)
Lab: NEGATIVE mL/min — ABNORMAL LOW (ref 0–0.80)

## 2017-07-11 LAB — CBC
Lab: 2.8 M/UL — ABNORMAL LOW (ref 4.0–5.0)
Lab: 26 % — ABNORMAL LOW (ref 36–45)
Lab: 6.2 10*3/uL (ref 4.5–11.0)
Lab: 8.6 g/dL — ABNORMAL LOW (ref 12.0–15.0)

## 2017-07-11 LAB — URINALYSIS MICROSCOPIC REFLEX TO CULTURE

## 2017-07-11 LAB — COMPREHENSIVE METABOLIC PANEL
Lab: 106 MMOL/L — ABNORMAL LOW (ref 98–110)
Lab: 118 mg/dL — ABNORMAL HIGH (ref 70–100)
Lab: 134 MMOL/L — ABNORMAL LOW (ref 137–147)
Lab: 37 mg/dL — ABNORMAL HIGH (ref 7–25)

## 2017-07-11 LAB — POC GLUCOSE: Lab: 119 mg/dL — ABNORMAL HIGH (ref 70–100)

## 2017-07-11 LAB — LIPASE: Lab: 10 U/L — ABNORMAL LOW (ref 11–82)

## 2017-07-11 LAB — POC LACTATE: Lab: 0.5 MMOL/L (ref 0.5–2.0)

## 2017-07-11 LAB — POC CREATININE, RAD: Lab: 1.7 mg/dL — ABNORMAL HIGH (ref 0.4–1.00)

## 2017-07-11 LAB — CBC AND DIFF
Lab: 0.1 10*3/uL (ref 0–0.20)
Lab: 5.6 10*3/uL (ref 4.5–11.0)

## 2017-07-11 LAB — POC TROPONIN: Lab: 0 ng/mL (ref 0.00–0.05)

## 2017-07-11 MED ORDER — SIMVASTATIN 20 MG PO TAB
20 mg | Freq: Every evening | ORAL | 0 refills | Status: DC
Start: 2017-07-11 — End: 2017-07-14
  Administered 2017-07-12 – 2017-07-14 (×3): 20 mg via ORAL

## 2017-07-11 MED ORDER — LEVOTHYROXINE 50 MCG PO TAB
50 ug | Freq: Every day | ORAL | 0 refills | Status: DC
Start: 2017-07-11 — End: 2017-07-14
  Administered 2017-07-12 – 2017-07-14 (×3): 50 ug via ORAL

## 2017-07-11 MED ORDER — ISOSORBIDE MONONITRATE 60 MG PO TB24
120 mg | Freq: Every morning | ORAL | 0 refills | Status: DC
Start: 2017-07-11 — End: 2017-07-11

## 2017-07-11 MED ORDER — SODIUM CHLORIDE 0.9 % IV SOLP
INTRAVENOUS | 0 refills | Status: CN
Start: 2017-07-11 — End: ?

## 2017-07-11 MED ORDER — RANOLAZINE 1,000 MG PO TB12
1000 mg | Freq: Two times a day (BID) | ORAL | 0 refills | Status: DC
Start: 2017-07-11 — End: 2017-07-14
  Administered 2017-07-12 – 2017-07-14 (×6): 1000 mg via ORAL

## 2017-07-11 MED ORDER — FERROUS SULFATE 325 MG (65 MG IRON) PO TAB
325 mg | Freq: Two times a day (BID) | ORAL | 0 refills | Status: DC
Start: 2017-07-11 — End: 2017-07-14
  Administered 2017-07-12 – 2017-07-14 (×6): 325 mg via ORAL

## 2017-07-11 MED ORDER — ISOSORBIDE MONONITRATE 60 MG PO TB24
120 mg | Freq: Every morning | ORAL | 0 refills | Status: DC
Start: 2017-07-11 — End: 2017-07-14
  Administered 2017-07-12 – 2017-07-14 (×4): 120 mg via ORAL

## 2017-07-11 MED ORDER — HYDRALAZINE 50 MG PO TAB
50 mg | Freq: Two times a day (BID) | ORAL | 0 refills | Status: DC
Start: 2017-07-11 — End: 2017-07-14
  Administered 2017-07-11 – 2017-07-14 (×5): 50 mg via ORAL

## 2017-07-11 MED ORDER — METOPROLOL SUCCINATE 100 MG PO TB24
100 mg | Freq: Every day | ORAL | 0 refills | Status: DC
Start: 2017-07-11 — End: 2017-07-11

## 2017-07-11 MED ORDER — PEG-ELECTROLYTE SOLN 420 GRAM PO SOLR
2 L | ORAL | 0 refills | Status: DC | PRN
Start: 2017-07-11 — End: 2017-07-14

## 2017-07-11 MED ORDER — PRAMIPEXOLE 0.25 MG PO TAB
.5 mg | Freq: Every day | ORAL | 0 refills | Status: DC
Start: 2017-07-11 — End: 2017-07-14
  Administered 2017-07-12 – 2017-07-14 (×3): 0.5 mg via ORAL

## 2017-07-11 MED ORDER — BISACODYL 5 MG PO TBEC
10 mg | Freq: Once | ORAL | 0 refills | Status: AC | PRN
Start: 2017-07-11 — End: ?

## 2017-07-11 MED ORDER — PEG-ELECTROLYTE SOLN 420 GRAM PO SOLR
4 L | ORAL | 0 refills | Status: DC
Start: 2017-07-11 — End: 2017-07-14
  Administered 2017-07-12: 01:00:00 4 L via ORAL

## 2017-07-11 MED ORDER — NICARDIPINE 20 MG PO CAP
20 mg | Freq: Three times a day (TID) | ORAL | 0 refills | Status: DC
Start: 2017-07-11 — End: 2017-07-11

## 2017-07-11 MED ORDER — INSULIN ASPART 100 UNIT/ML SC FLEXPEN
0-6 [IU] | Freq: Before meals | SUBCUTANEOUS | 0 refills | Status: DC
Start: 2017-07-11 — End: 2017-07-14
  Administered 2017-07-13: 05:00:00 1 [IU] via SUBCUTANEOUS

## 2017-07-12 ENCOUNTER — Encounter: Admit: 2017-07-12 | Discharge: 2017-07-12 | Payer: MEDICARE

## 2017-07-12 ENCOUNTER — Inpatient Hospital Stay: Admit: 2017-07-12 | Discharge: 2017-07-12 | Payer: MEDICARE

## 2017-07-12 DIAGNOSIS — K922 Gastrointestinal hemorrhage, unspecified: ICD-10-CM

## 2017-07-12 LAB — HEMOGLOBIN & HEMATOCRIT
Lab: 26 % — ABNORMAL LOW (ref 36–45)
Lab: 8.7 g/dL — ABNORMAL LOW (ref 12.0–15.0)

## 2017-07-12 LAB — CBC
Lab: 3.2 M/UL — ABNORMAL LOW (ref 4.0–5.0)
Lab: 7.5 K/UL — ABNORMAL LOW (ref 4.5–11.0)
Lab: 4.9 K/UL — ABNORMAL LOW (ref >60)
Lab: 16 % — ABNORMAL HIGH (ref 11–15)
Lab: 27 % — ABNORMAL LOW (ref 36–45)
Lab: 3 M/UL — ABNORMAL LOW (ref 4.0–5.0)
Lab: 30 pg (ref 26–34)
Lab: 34 g/dL (ref 32.0–36.0)
Lab: 366 10*3/uL (ref 150–400)
Lab: 5.4 10*3/uL (ref 4.5–11.0)
Lab: 7.4 FL (ref 7–11)
Lab: 89 FL (ref 80–100)
Lab: 9.4 g/dL — ABNORMAL LOW (ref 12.0–15.0)
Lab: 16 % — ABNORMAL HIGH (ref 11–15)
Lab: 2.9 M/UL — ABNORMAL LOW (ref 4.0–5.0)
Lab: 26 % — ABNORMAL LOW (ref 36–45)
Lab: 30 pg (ref 26–34)
Lab: 33 g/dL (ref 32.0–36.0)
Lab: 366 10*3/uL — ABNORMAL LOW (ref >60)
Lab: 5.2 10*3/uL — ABNORMAL LOW (ref 4.5–11.0)
Lab: 7.3 FL (ref >60)
Lab: 8.9 g/dL — ABNORMAL LOW (ref 12.0–15.0)
Lab: 90 FL (ref 80–100)

## 2017-07-12 LAB — POC GLUCOSE
Lab: 168 mg/dL — ABNORMAL HIGH (ref 70–100)
Lab: 152 mg/dL — ABNORMAL HIGH (ref 70–100)
Lab: 123 mg/dL — ABNORMAL HIGH (ref 70–100)

## 2017-07-12 LAB — PHOSPHORUS: Lab: 4 mg/dL — ABNORMAL HIGH (ref >60)

## 2017-07-12 LAB — MAGNESIUM: Lab: 2 mg/dL — ABNORMAL LOW (ref >60)

## 2017-07-12 LAB — COMPREHENSIVE METABOLIC PANEL: Lab: 135 MMOL/L — ABNORMAL LOW (ref 137–147)

## 2017-07-12 MED ORDER — LIDOCAINE (PF) 200 MG/10 ML (2 %) IJ SYRG
0 refills | Status: DC
Start: 2017-07-12 — End: 2017-07-12
  Administered 2017-07-12: 23:00:00 60 mg via INTRAVENOUS

## 2017-07-12 MED ORDER — HYDRALAZINE 20 MG/ML IJ SOLN
10 mg | INTRAVENOUS | 0 refills | Status: DC | PRN
Start: 2017-07-12 — End: 2017-07-14

## 2017-07-12 MED ORDER — PROPOFOL 10 MG/ML IV EMUL 20 ML (INFUSION)(AM)(OR)
INTRAVENOUS | 0 refills | Status: DC
Start: 2017-07-12 — End: 2017-07-12
  Administered 2017-07-12: 23:00:00 120 ug/kg/min via INTRAVENOUS

## 2017-07-12 MED ORDER — LACTATED RINGERS IV SOLP
1000 mL | INTRAVENOUS | 0 refills | Status: DC
Start: 2017-07-12 — End: 2017-07-13
  Administered 2017-07-12: 22:00:00 1000 mL via INTRAVENOUS

## 2017-07-12 MED ORDER — PROPOFOL INJ 10 MG/ML IV VIAL
0 refills | Status: DC
Start: 2017-07-12 — End: 2017-07-12
  Administered 2017-07-12: 23:00:00 30 mg via INTRAVENOUS

## 2017-07-12 MED ORDER — PEG-ELECTROLYTE SOLN 420 GRAM PO SOLR
0 refills | Status: DC
Start: 2017-07-12 — End: 2017-07-12
  Administered 2017-07-12: 23:00:00 4 L

## 2017-07-12 MED ORDER — PHENYLEPHRINE IN 0.9% NACL(PF) 1 MG/10 ML (100 MCG/ML) IV SYRG
0 refills | Status: DC
Start: 2017-07-12 — End: 2017-07-12
  Administered 2017-07-12: 100 ug via INTRAVENOUS

## 2017-07-12 MED ORDER — SODIUM CHLORIDE 0.9 % IV SOLP
1000 mL | INTRAVENOUS | 0 refills | Status: DC
Start: 2017-07-12 — End: 2017-07-13
  Administered 2017-07-13: 15:00:00 1000 mL via INTRAVENOUS

## 2017-07-13 LAB — PHOSPHORUS: Lab: 3.9 mg/dL — ABNORMAL LOW (ref >60)

## 2017-07-13 LAB — POC GLUCOSE
Lab: 253 mg/dL — ABNORMAL HIGH (ref 70–100)
Lab: 157 mg/dL — ABNORMAL HIGH (ref 70–100)
Lab: 267 mg/dL — ABNORMAL HIGH (ref 70–100)
Lab: 98 mg/dL (ref 70–100)

## 2017-07-13 LAB — CBC
Lab: 5.1 10*3/uL — ABNORMAL LOW (ref 4.5–11.0)
Lab: 7.9 g/dL — ABNORMAL LOW (ref 12.0–15.0)

## 2017-07-13 LAB — HEMOGLOBIN & HEMATOCRIT
Lab: 8.3 g/dL — ABNORMAL LOW (ref 12.0–15.0)
Lab: 27 % — ABNORMAL LOW (ref 36–45)
Lab: 9.1 g/dL — ABNORMAL LOW (ref 12.0–15.0)

## 2017-07-13 LAB — COMPREHENSIVE METABOLIC PANEL: Lab: 136 MMOL/L — ABNORMAL LOW (ref 137–147)

## 2017-07-13 LAB — MAGNESIUM: Lab: 1.7 mg/dL — ABNORMAL LOW (ref 1.6–2.6)

## 2017-07-13 MED ORDER — FUROSEMIDE 20 MG PO TAB
40 mg | Freq: Every morning | ORAL | 0 refills | Status: DC
Start: 2017-07-13 — End: 2017-07-14
  Administered 2017-07-13 – 2017-07-14 (×2): 40 mg via ORAL

## 2017-07-13 MED ORDER — INSULIN GLARGINE 100 UNIT/ML (3 ML) SC INJ PEN
10 [IU] | Freq: Every evening | SUBCUTANEOUS | 0 refills | Status: DC
Start: 2017-07-13 — End: 2017-07-14
  Administered 2017-07-14: 04:00:00 10 [IU] via SUBCUTANEOUS

## 2017-07-13 MED ORDER — INSULIN DETEMIR U-100 100 UNIT/ML SC SOLN
10 [IU] | Freq: Every day | SUBCUTANEOUS | 0 refills | Status: DC
Start: 2017-07-13 — End: 2017-07-13

## 2017-07-14 ENCOUNTER — Encounter: Admit: 2017-07-14 | Discharge: 2017-07-14 | Payer: MEDICARE

## 2017-07-14 ENCOUNTER — Emergency Department: Admit: 2017-07-11 | Discharge: 2017-07-11 | Payer: MEDICARE

## 2017-07-14 ENCOUNTER — Inpatient Hospital Stay
Admit: 2017-07-11 | Discharge: 2017-07-14 | Disposition: A | Discharge status: Other Acute Inpatient Hospital | Payer: MEDICARE

## 2017-07-14 ENCOUNTER — Inpatient Hospital Stay: Admit: 2017-07-12 | Discharge: 2017-07-12 | Payer: MEDICARE

## 2017-07-14 DIAGNOSIS — Z9049 Acquired absence of other specified parts of digestive tract: ICD-10-CM

## 2017-07-14 DIAGNOSIS — E785 Hyperlipidemia, unspecified: ICD-10-CM

## 2017-07-14 DIAGNOSIS — K769 Liver disease, unspecified: ICD-10-CM

## 2017-07-14 DIAGNOSIS — K635 Polyp of colon: ICD-10-CM

## 2017-07-14 DIAGNOSIS — E1122 Type 2 diabetes mellitus with diabetic chronic kidney disease: ICD-10-CM

## 2017-07-14 DIAGNOSIS — Z7982 Long term (current) use of aspirin: ICD-10-CM

## 2017-07-14 DIAGNOSIS — K644 Residual hemorrhoidal skin tags: ICD-10-CM

## 2017-07-14 DIAGNOSIS — I2510 Atherosclerotic heart disease of native coronary artery without angina pectoris: ICD-10-CM

## 2017-07-14 DIAGNOSIS — Z951 Presence of aortocoronary bypass graft: ICD-10-CM

## 2017-07-14 DIAGNOSIS — I129 Hypertensive chronic kidney disease with stage 1 through stage 4 chronic kidney disease, or unspecified chronic kidney disease: ICD-10-CM

## 2017-07-14 DIAGNOSIS — E039 Hypothyroidism, unspecified: ICD-10-CM

## 2017-07-14 DIAGNOSIS — Z947 Corneal transplant status: ICD-10-CM

## 2017-07-14 DIAGNOSIS — I10 Essential (primary) hypertension: ICD-10-CM

## 2017-07-14 DIAGNOSIS — H269 Unspecified cataract: ICD-10-CM

## 2017-07-14 DIAGNOSIS — K921 Melena: ICD-10-CM

## 2017-07-14 DIAGNOSIS — Z794 Long term (current) use of insulin: ICD-10-CM

## 2017-07-14 DIAGNOSIS — N184 Chronic kidney disease, stage 4 (severe): ICD-10-CM

## 2017-07-14 DIAGNOSIS — K5731 Diverticulosis of large intestine without perforation or abscess with bleeding: Principal | ICD-10-CM

## 2017-07-14 DIAGNOSIS — K5900 Constipation, unspecified: ICD-10-CM

## 2017-07-14 DIAGNOSIS — D1779 Benign lipomatous neoplasm of other sites: ICD-10-CM

## 2017-07-14 DIAGNOSIS — D259 Leiomyoma of uterus, unspecified: ICD-10-CM

## 2017-07-14 DIAGNOSIS — Z66 Do not resuscitate: ICD-10-CM

## 2017-07-14 DIAGNOSIS — I517 Cardiomegaly: ICD-10-CM

## 2017-07-14 DIAGNOSIS — Z9841 Cataract extraction status, right eye: ICD-10-CM

## 2017-07-14 DIAGNOSIS — Z9842 Cataract extraction status, left eye: ICD-10-CM

## 2017-07-14 DIAGNOSIS — E119 Type 2 diabetes mellitus without complications: ICD-10-CM

## 2017-07-14 LAB — COMPREHENSIVE METABOLIC PANEL: Lab: 137 MMOL/L — ABNORMAL LOW (ref 137–147)

## 2017-07-14 LAB — MAGNESIUM: Lab: 1.6 mg/dL — ABNORMAL HIGH (ref 1.6–2.6)

## 2017-07-14 LAB — HEMOGLOBIN & HEMATOCRIT: Lab: 8.9 g/dL — ABNORMAL LOW (ref >60)

## 2017-07-14 LAB — POC GLUCOSE
Lab: 142 mg/dL — ABNORMAL HIGH (ref 70–100)
Lab: 142 mg/dL — ABNORMAL HIGH (ref 70–100)

## 2017-07-14 LAB — PHOSPHORUS: Lab: 3.9 mg/dL — ABNORMAL HIGH (ref >60)

## 2017-07-14 MED ORDER — ASPIRIN 81 MG PO TBEC
81 mg | ORAL_TABLET | Freq: Every day | ORAL | 3 refills | Status: AC
Start: 2017-07-14 — End: ?

## 2017-07-14 MED ORDER — METOPROLOL SUCCINATE 100 MG PO TB24
100 mg | ORAL_TABLET | Freq: Every day | ORAL | 5 refills | 90.00000 days | Status: AC
Start: 2017-07-14 — End: ?

## 2017-07-15 LAB — COMPREHENSIVE METABOLIC PANEL
Lab: 0.3
Lab: 2.1 — ABNORMAL HIGH (ref 0.57–1.11)
Lab: 23
Lab: 26 — ABNORMAL HIGH (ref 9.8–20.1)
Lab: 3.6
Lab: 6.3
Lab: 9.4
Lab: 94

## 2017-08-18 ENCOUNTER — Encounter: Admit: 2017-08-18 | Discharge: 2017-08-18 | Payer: MEDICARE

## 2017-08-18 DIAGNOSIS — Z9049 Acquired absence of other specified parts of digestive tract: ICD-10-CM

## 2017-08-18 DIAGNOSIS — H269 Unspecified cataract: ICD-10-CM

## 2017-08-18 DIAGNOSIS — E119 Type 2 diabetes mellitus without complications: ICD-10-CM

## 2017-08-18 DIAGNOSIS — I10 Essential (primary) hypertension: ICD-10-CM

## 2017-09-20 ENCOUNTER — Encounter: Admit: 2017-09-20 | Discharge: 2017-09-20 | Payer: MEDICARE

## 2017-09-22 ENCOUNTER — Encounter: Admit: 2017-09-22 | Discharge: 2017-09-22 | Payer: MEDICARE

## 2017-09-27 ENCOUNTER — Encounter: Admit: 2017-09-27 | Discharge: 2017-09-27 | Payer: MEDICARE

## 2017-10-03 LAB — LIPID PROFILE
Lab: 109 — ABNORMAL HIGH (ref <100)
Lab: 112
Lab: 168
Lab: 22
Lab: 4
Lab: 47

## 2017-10-03 LAB — COMPREHENSIVE METABOLIC PANEL
Lab: 16 — ABNORMAL HIGH (ref 0–14)
Lab: 17
Lab: 84

## 2017-10-04 ENCOUNTER — Encounter: Admit: 2017-10-04 | Discharge: 2017-10-04 | Payer: MEDICARE

## 2017-10-05 ENCOUNTER — Encounter: Admit: 2017-10-05 | Discharge: 2017-10-05 | Payer: MEDICARE

## 2017-10-07 ENCOUNTER — Encounter: Admit: 2017-10-07 | Discharge: 2017-10-07 | Payer: MEDICARE

## 2017-10-07 ENCOUNTER — Ambulatory Visit: Admit: 2017-10-07 | Discharge: 2017-10-08 | Payer: MEDICARE

## 2017-10-07 DIAGNOSIS — I25118 Atherosclerotic heart disease of native coronary artery with other forms of angina pectoris: Principal | ICD-10-CM

## 2017-10-13 ENCOUNTER — Ambulatory Visit: Admit: 2017-10-13 | Discharge: 2017-10-14 | Payer: MEDICARE

## 2017-10-13 ENCOUNTER — Encounter: Admit: 2017-10-13 | Discharge: 2017-10-13 | Payer: MEDICARE

## 2017-10-13 DIAGNOSIS — I25118 Atherosclerotic heart disease of native coronary artery with other forms of angina pectoris: ICD-10-CM

## 2017-10-13 DIAGNOSIS — I10 Essential (primary) hypertension: ICD-10-CM

## 2017-10-13 DIAGNOSIS — E785 Hyperlipidemia, unspecified: ICD-10-CM

## 2017-10-13 DIAGNOSIS — E119 Type 2 diabetes mellitus without complications: ICD-10-CM

## 2017-10-13 DIAGNOSIS — Z9049 Acquired absence of other specified parts of digestive tract: Secondary | ICD-10-CM

## 2017-10-13 DIAGNOSIS — H269 Unspecified cataract: ICD-10-CM

## 2017-10-13 DIAGNOSIS — I350 Nonrheumatic aortic (valve) stenosis: Principal | ICD-10-CM

## 2017-10-14 ENCOUNTER — Ambulatory Visit: Admit: 2017-10-14 | Discharge: 2017-10-14 | Payer: MEDICARE

## 2017-10-14 DIAGNOSIS — H35373 Puckering of macula, bilateral: ICD-10-CM

## 2017-10-14 DIAGNOSIS — H43823 Vitreomacular adhesion, bilateral: ICD-10-CM

## 2017-10-14 DIAGNOSIS — Z947 Corneal transplant status: Principal | ICD-10-CM

## 2017-10-14 DIAGNOSIS — E119 Type 2 diabetes mellitus without complications: ICD-10-CM

## 2017-10-14 MED ORDER — PREDNISOLONE ACETATE 1 % OP DRPS
1 [drp] | OPHTHALMIC | 3 refills | 14.00000 days | Status: AC
Start: 2017-10-14 — End: 2018-04-19

## 2017-10-27 ENCOUNTER — Encounter: Admit: 2017-10-27 | Discharge: 2017-10-27 | Payer: MEDICARE

## 2018-02-09 ENCOUNTER — Encounter: Admit: 2018-02-09 | Discharge: 2018-02-09 | Payer: MEDICARE

## 2018-02-09 ENCOUNTER — Ambulatory Visit: Admit: 2018-02-09 | Discharge: 2018-02-10 | Payer: MEDICARE

## 2018-02-09 DIAGNOSIS — I10 Essential (primary) hypertension: ICD-10-CM

## 2018-02-09 DIAGNOSIS — R609 Edema, unspecified: Principal | ICD-10-CM

## 2018-04-19 ENCOUNTER — Ambulatory Visit: Admit: 2018-04-19 | Discharge: 2018-04-20 | Payer: MEDICARE

## 2018-04-19 DIAGNOSIS — Z947 Corneal transplant status: ICD-10-CM

## 2018-04-19 DIAGNOSIS — Z961 Presence of intraocular lens: ICD-10-CM

## 2018-04-19 MED ORDER — PREDNISOLONE ACETATE 1 % OP DRPS
1 [drp] | Freq: Every day | OPHTHALMIC | 3 refills | 5.00000 days | Status: AC
Start: 2018-04-19 — End: ?

## 2018-04-20 DIAGNOSIS — H35373 Puckering of macula, bilateral: Principal | ICD-10-CM

## 2018-08-24 ENCOUNTER — Encounter: Admit: 2018-08-24 | Discharge: 2018-08-24 | Payer: MEDICARE

## 2018-09-15 DEATH — deceased

## 2019-11-02 IMAGING — CR CHEST
2 series · 2 of 2 positions shown · non-contrast
Comparison: none

[chest pa x-wise]
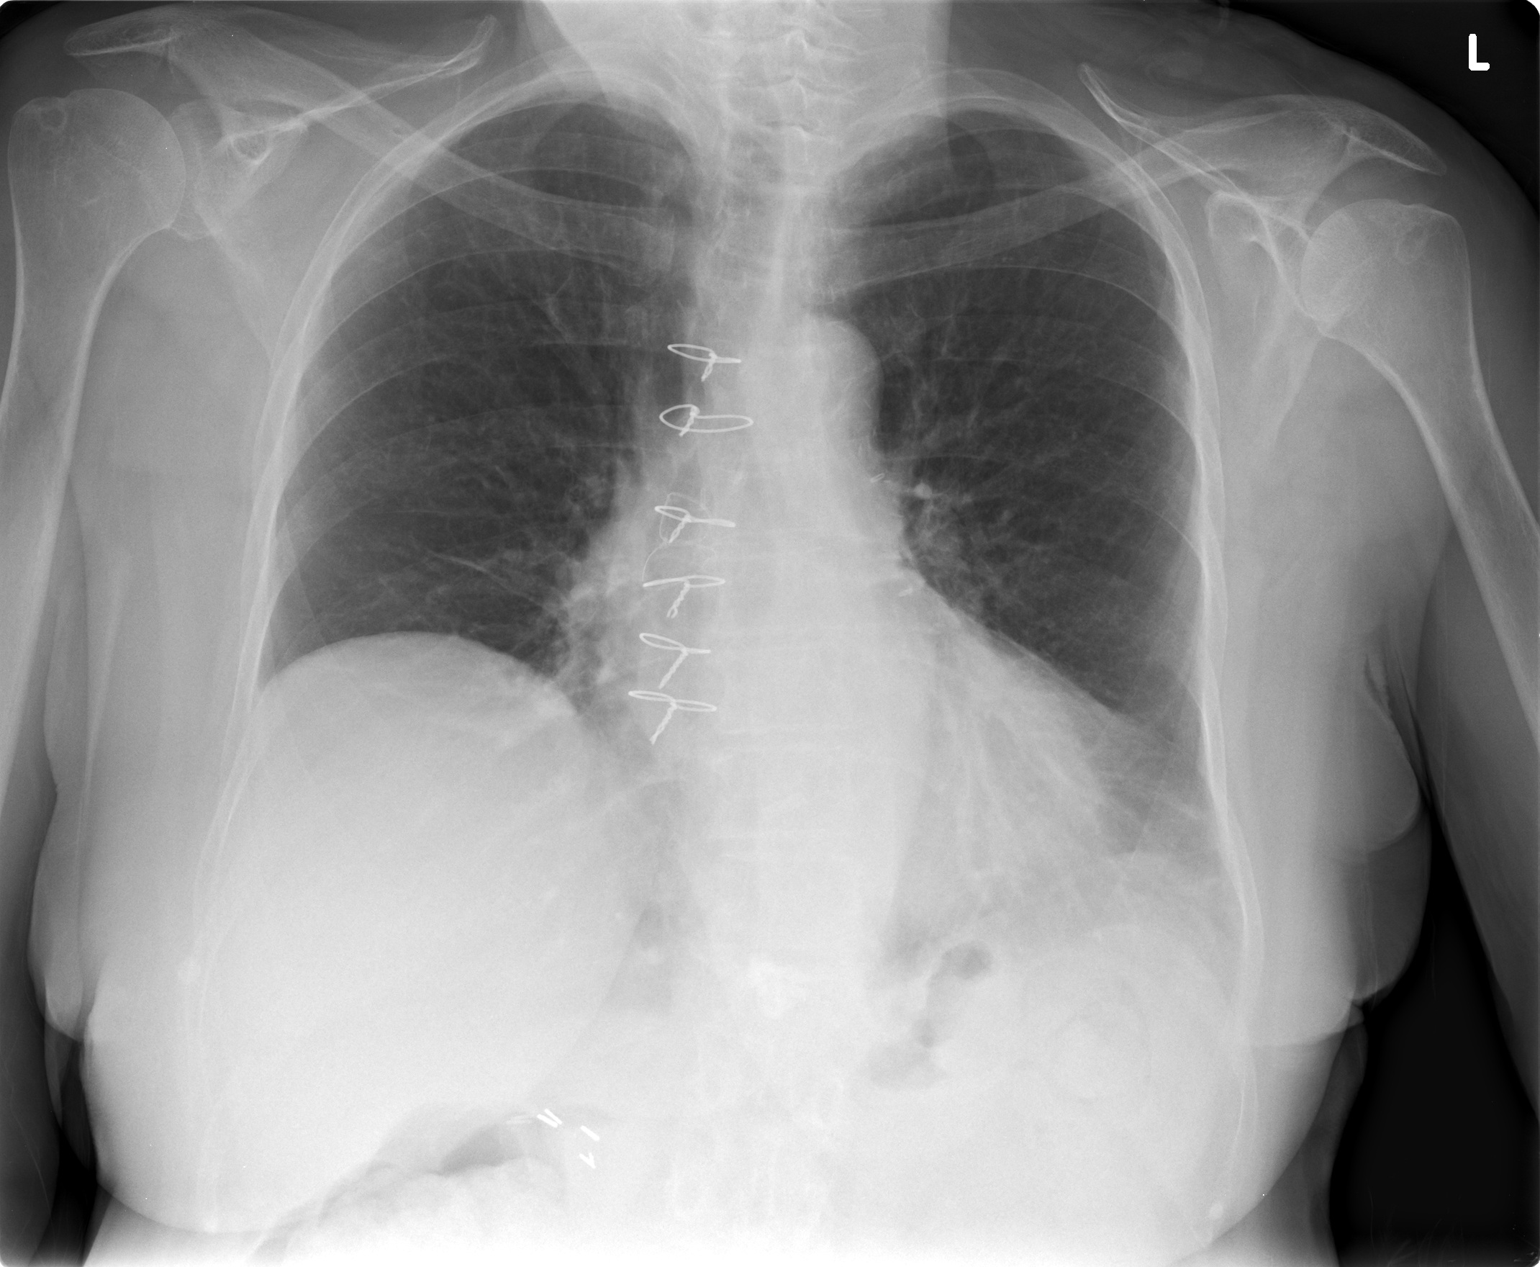

[chest lat]
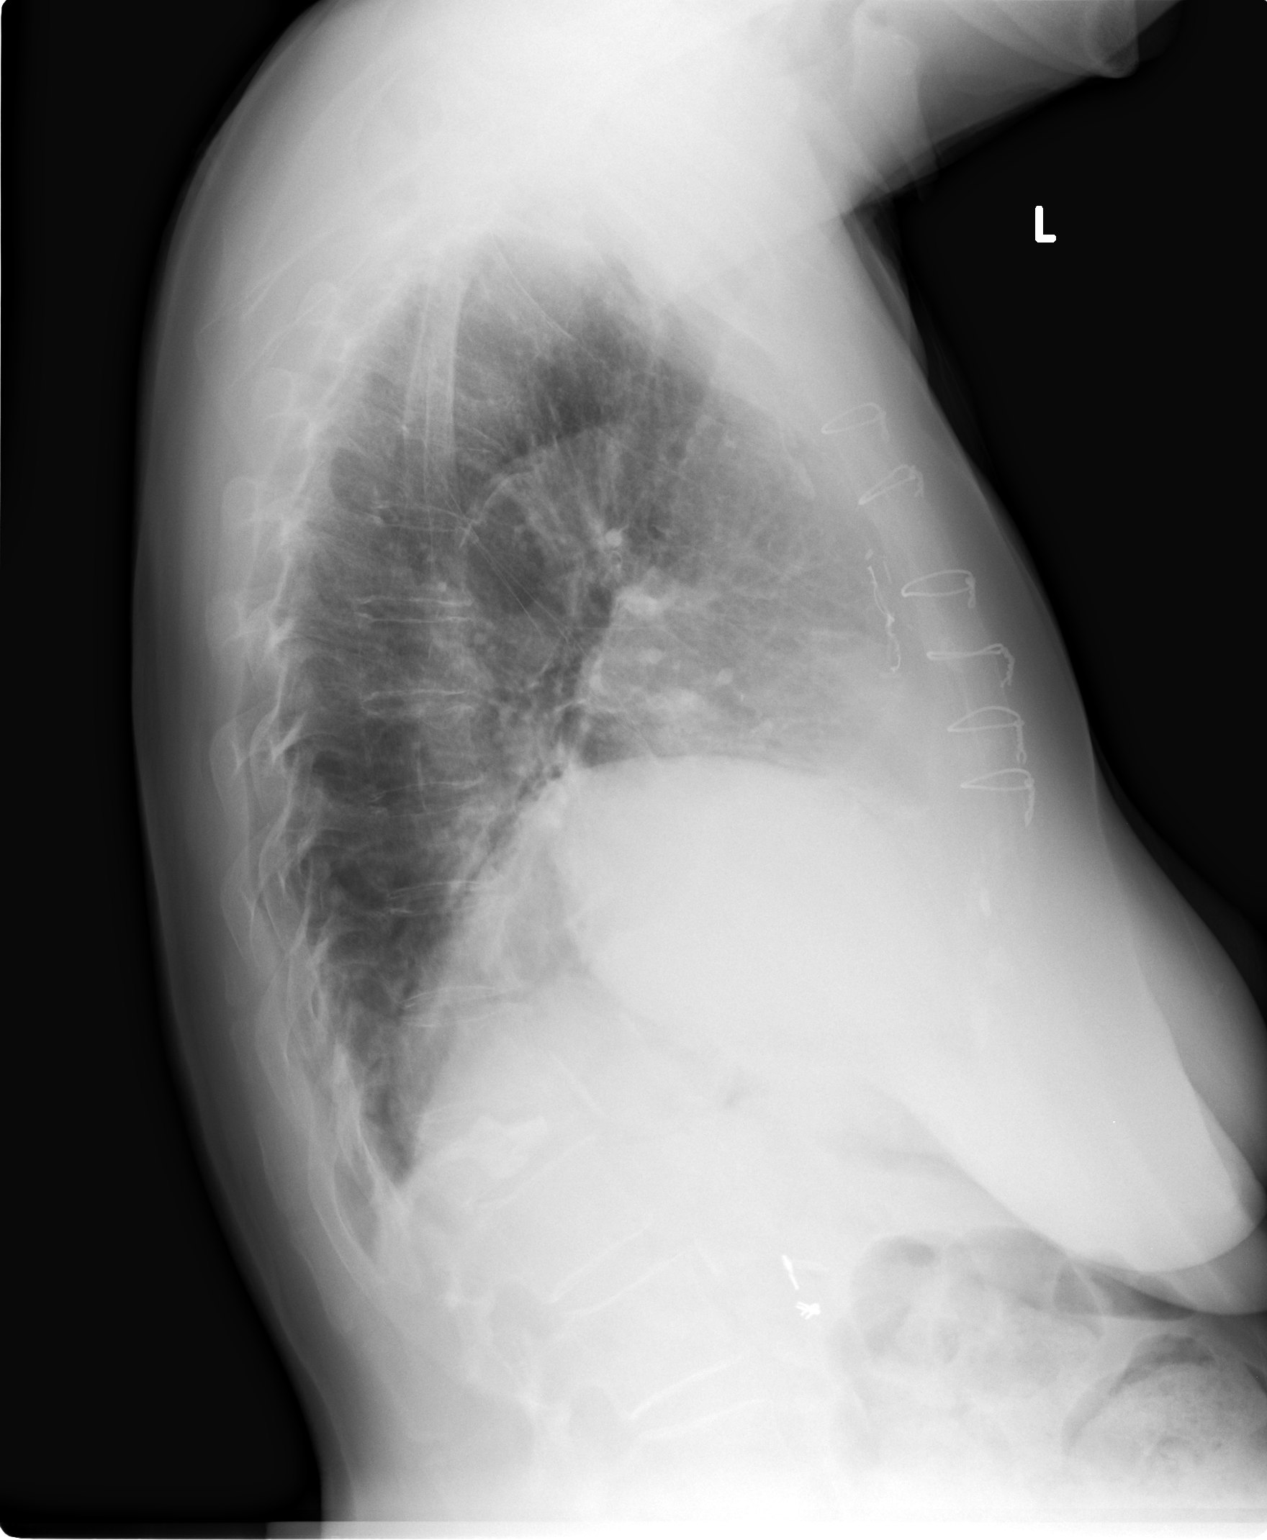

[2 of 2 positions shown; findings below may reference images not displayed]

DIAGNOSTIC STUDIES

EXAM
Chest radiographs.

INDICATION
HTN
htn, edmea, hx bypass x 4 19yrs ago. jl

TECHNIQUE
PA and lateral chest views.

COMPARISONS
January 05, 2018.

FINDINGS
Median sternotomy wires with evidence of CABG. Cholecystectomy clips. Elevation of the right
hemidiaphragm is again noted, not significantly changed. No new consolidation, effusion, or
pneumothorax. Mildly thickened interstitial markings.

IMPRESSION
No significant change.

Tech Notes:

htn, edmea, hx bypass x 4  19yrs ago. jl

## 2019-11-02 IMAGING — US ECHOCOMPL
1 series · 14 of 24 positions shown · non-contrast
Comparison: none

[Series 1: us echo 2d, wo/w m-mode, compl · 103 acquisitions, 14 frames shown]
[im 1/103]
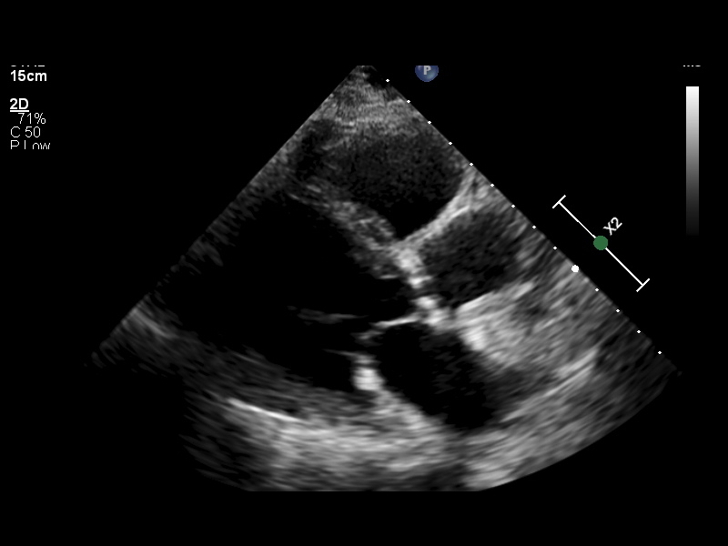
[im 9/103]
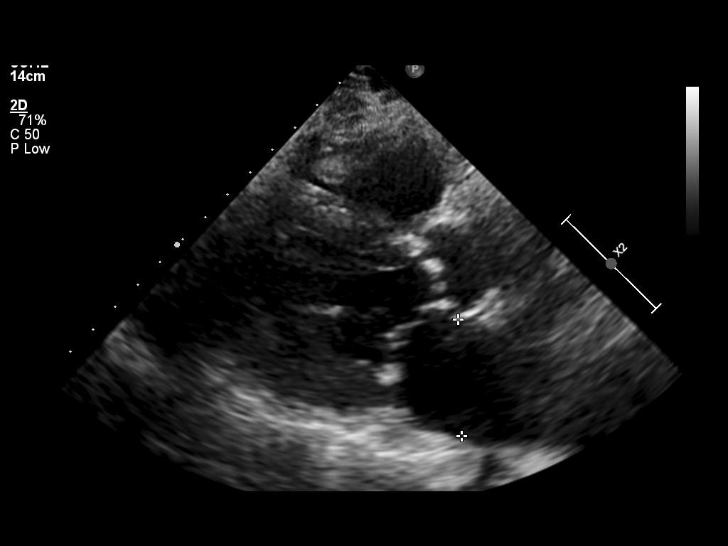
[im 18/103]
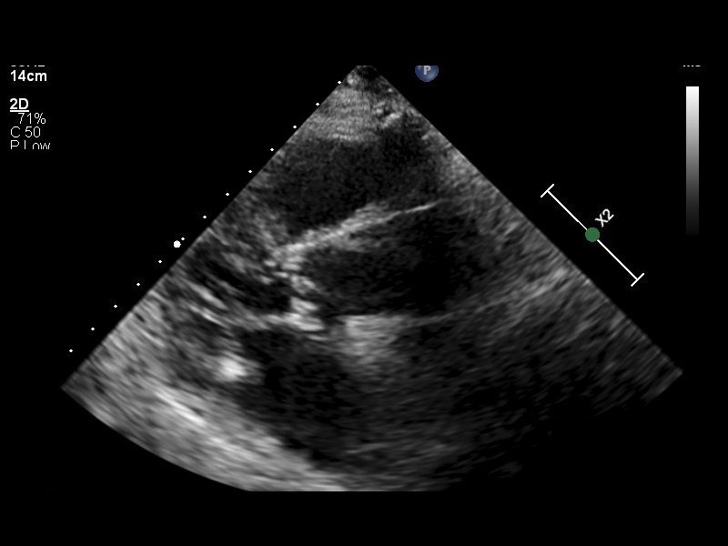
[im 27/103]
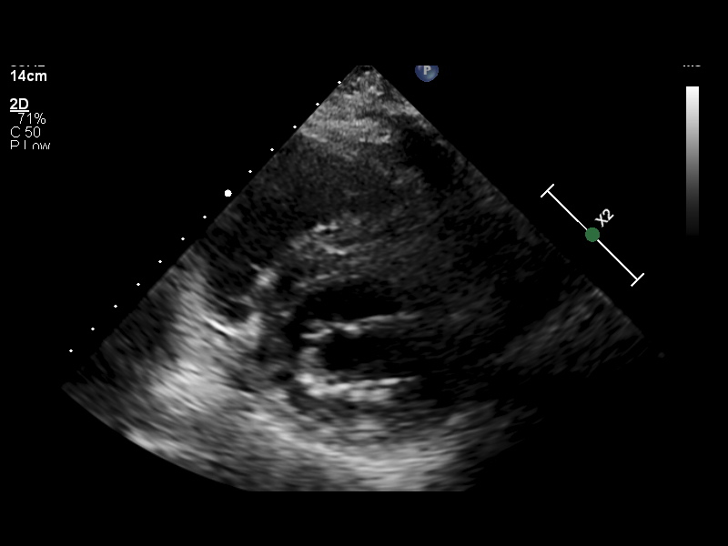
[im 32/103]
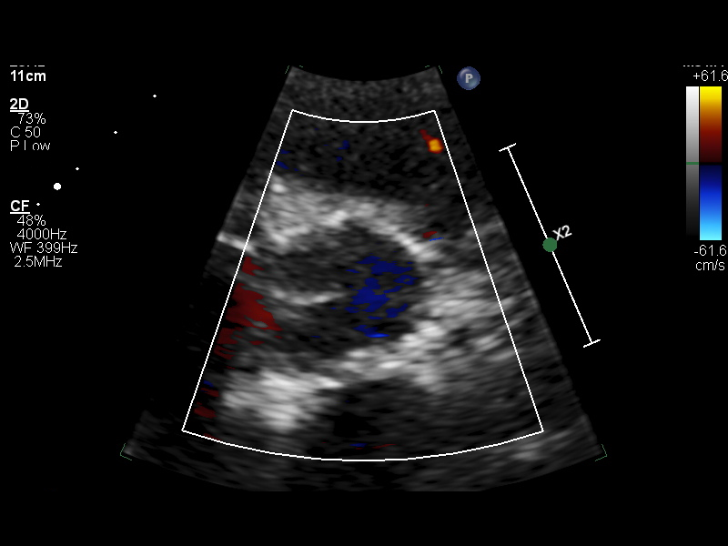
[im 45/103]
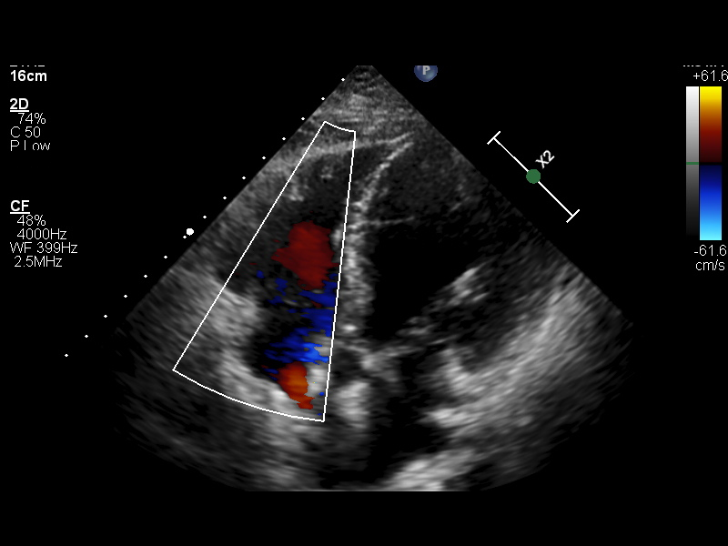
[im 54/103]
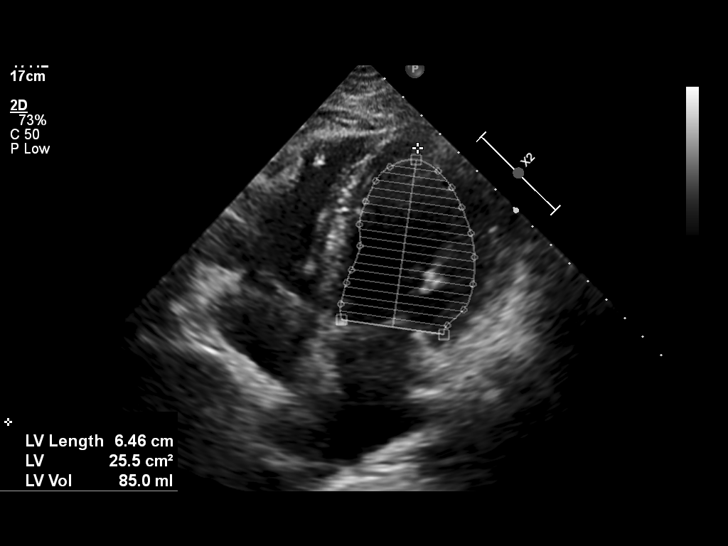
[im 58/103]
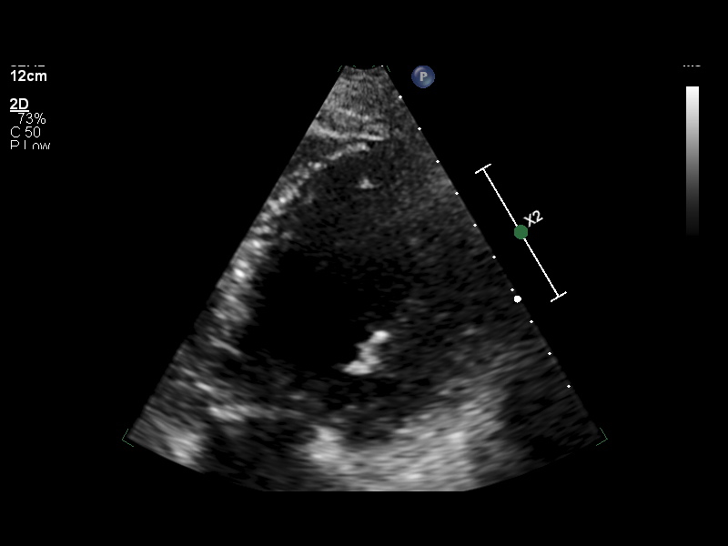
[im 63/103]
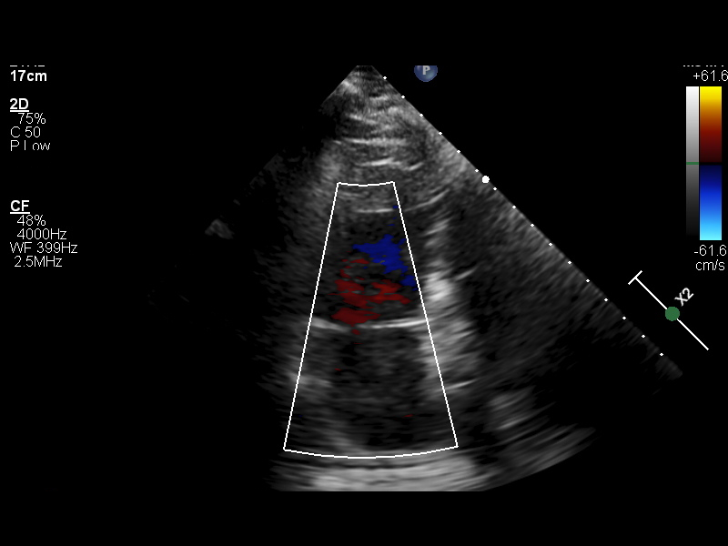
[im 71/103]
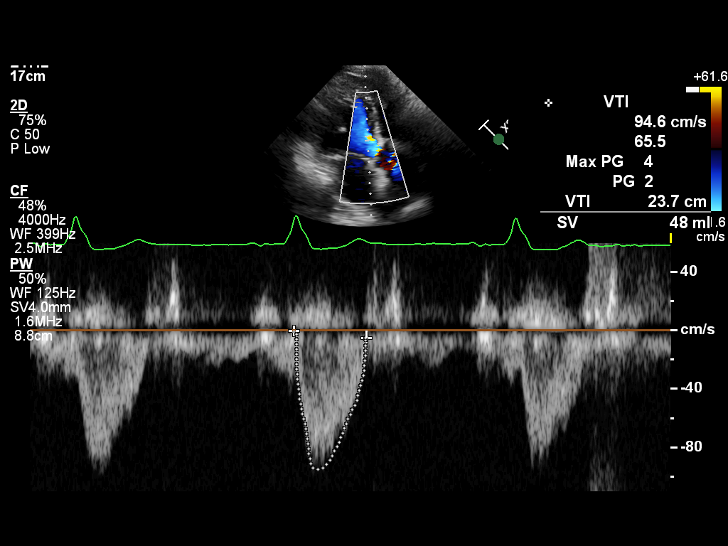
[im 80/103]
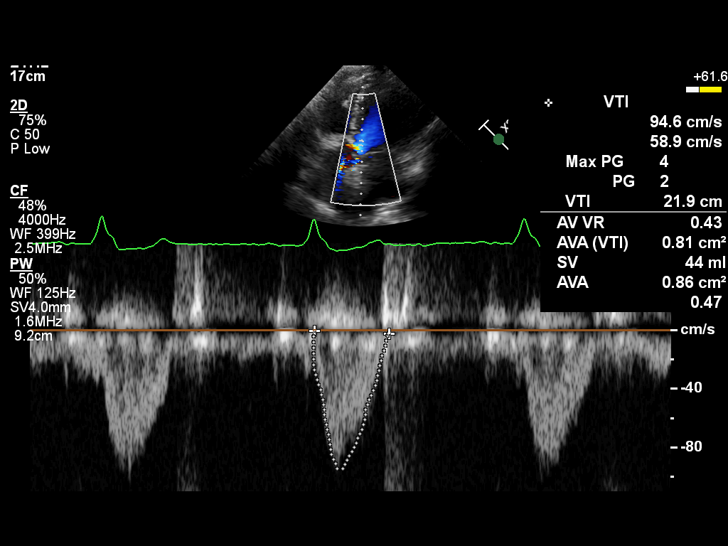
[im 85/103]
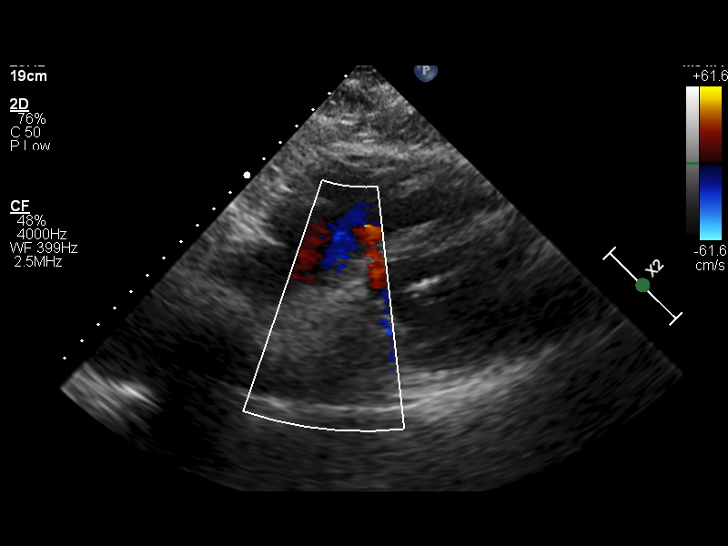
[im 94/103]
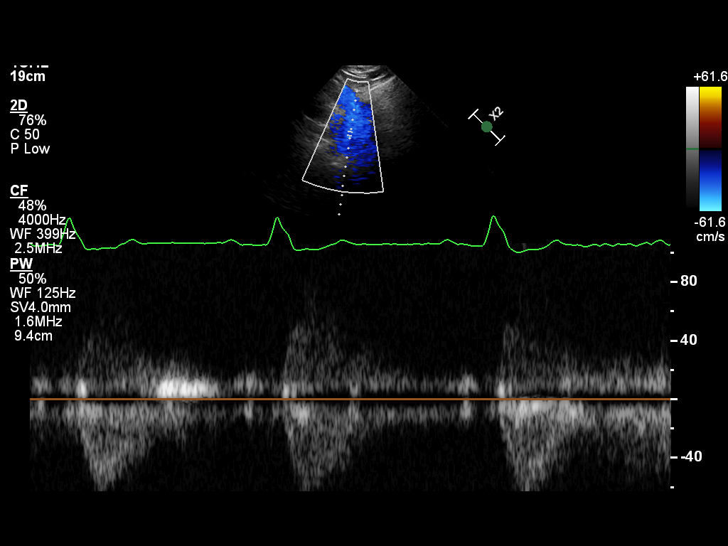
[im 103/103]
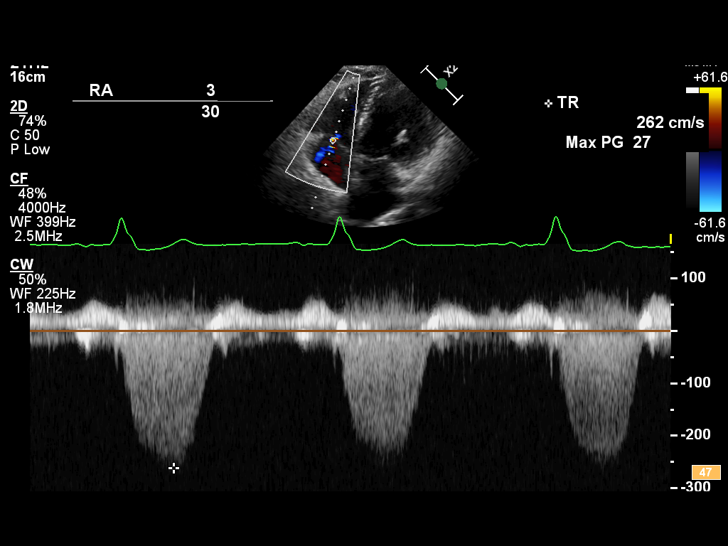

[14 of 24 positions shown; findings below may reference images not displayed]

FINAL REPORT IS SCANNED IN THE PATIENT'S EMR.

Tech Notes:

jl
